# Patient Record
Sex: Female | Born: 1960 | Hispanic: Yes | Marital: Married | Smoking: Current every day smoker
Health system: Southern US, Community
[De-identification: ages and names within clinical notes are randomized; demographics above are authoritative.]

## PROBLEM LIST (undated history)

## (undated) DIAGNOSIS — E785 Hyperlipidemia, unspecified: Secondary | ICD-10-CM

## (undated) HISTORY — DX: Hyperlipidemia, unspecified: E78.5

## (undated) HISTORY — PX: BREAST CYST ASPIRATION: SHX578

---

## 2010-08-07 ENCOUNTER — Encounter: Admission: RE | Admit: 2010-08-07 | Discharge: 2010-08-07 | Payer: Self-pay | Admitting: Obstetrics and Gynecology

## 2010-10-30 ENCOUNTER — Ambulatory Visit: Payer: Self-pay | Admitting: Family Medicine

## 2010-10-30 DIAGNOSIS — R079 Chest pain, unspecified: Secondary | ICD-10-CM | POA: Insufficient documentation

## 2010-11-03 ENCOUNTER — Telehealth (INDEPENDENT_AMBULATORY_CARE_PROVIDER_SITE_OTHER): Payer: Self-pay

## 2010-12-26 NOTE — Progress Notes (Signed)
Summary: Courtesy call  Phone Note Outgoing Call   Call placed by: Areta Haber CMA,  November 03, 2010 1:58 PM Summary of Call: Courtesy call to pt - Courtesy call mess [facility] of hm number. Initial call taken by: Areta Haber CMA,  November 03, 2010 1:59 PM

## 2010-12-26 NOTE — Assessment & Plan Note (Signed)
Summary: FELL AND RIBS HURT (rm 3)   Vital Signs:  Patient Profile:   50 Years Old Female CC:      fell x 7days ago on steps and landed on Rt rib area Height:     63 inches Weight:      141.50 pounds O2 Sat:      97 % O2 treatment:    Room Air Temp:     98.1 degrees F oral Pulse rate:   66 / minute Resp:     14 per minute BP sitting:   106 / 65  (left arm)  Pt. in pain?   yes    Location:   RT rib area    Intensity:   5                   Updated Prior Medication List: ASPIRIN 325 MG TABS (ASPIRIN)   Current Allergies: No known allergies History of Present Illness Chief Complaint: fell x 7days ago on steps and landed on Rt rib area History of Present Illness:  Subjective:  Patient fell one week ago and hit her left chest.  She complains of persistent pain in the area, but no shortness of breath.  No fevers, chills, and sweats   REVIEW OF SYSTEMS Constitutional Symptoms      Denies fever, chills, night sweats, weight loss, weight gain, and fatigue.  Eyes       Denies change in vision, eye pain, eye discharge, glasses, contact lenses, and eye surgery. Ear/Nose/Throat/Mouth       Denies hearing loss/aids, change in hearing, ear pain, ear discharge, dizziness, frequent runny nose, frequent nose bleeds, sinus problems, sore throat, hoarseness, and tooth pain or bleeding.  Respiratory       Denies dry cough, productive cough, wheezing, shortness of breath, asthma, bronchitis, and emphysema/COPD.  Cardiovascular       Denies murmurs, chest pain, and tires easily with exhertion.    Gastrointestinal       Denies stomach pain, nausea/vomiting, diarrhea, constipation, blood in bowel movements, and indigestion. Genitourniary       Denies painful urination, kidney stones, and loss of urinary control. Neurological       Denies paralysis, seizures, and fainting/blackouts. Musculoskeletal       Denies muscle pain, joint pain, joint stiffness, decreased range of motion, redness,  swelling, muscle weakness, and gout.      Comments: RT rib pain Skin       Denies bruising, unusual mles/lumps or sores, and hair/skin or nail changes.  Psych       Denies mood changes, temper/anger issues, anxiety/stress, speech problems, depression, and sleep problems. Other Comments: pt c/o fall x 7days ago and landed on her RT rib area. still having pain with coughing or deep breathing. no SOB or fever.   Past History:  Past Medical History: Unremarkable  Past Surgical History: Denies surgical history  Family History: father- Cancer-deceased (unsure of type of CA) Family History Hypertension  Social History: Married Current Smoker- 1 pack per week Alcohol use-yes- 2 drinks per wk Smoking Status:  current   Objective:  No acute distress, alert and oriented  Eyes:  Pupils are equal, round, and reactive to light and accomdation.  Extraocular movement is intact.  Conjunctivae are not inflamed.  Pharynx:  Normal  Neck:  Supple.  No adenopathy is present.  Lungs:  Clear to auscultation.  Breath sounds are equal.  Chest:  Tenderness right posterior/lateral ribs.  No crepitance Heart:  Regular rate and rhythm without murmurs, rubs, or gallops.  Abdomen:  Nontender without masses or hepatosplenomegaly.  Bowel sounds are present.  No CVA or flank tenderness.  Skin:  No rash Chest X-ray with right rib detail:  negative Assessment New Problems: RIB PAIN, RIGHT SIDED (ICD-786.50) CONTUSION, RIGHT CHEST WALL (ICD-922.1)   Plan New Medications/Changes: NAPROXEN 500 MG TABS (NAPROXEN) One by mouth two times a day pc  #20 x 1, 10/30/2010, Donna Christen MD  New Orders: T-DG Ribs Unilateral w/Chest*R* [71101] Rib Belt [L0210] New Patient Level III [16109] Planning Comments:   Dispensed rib belt.  Begin Naproxen. ([facility] information and instruction patient handout given)  Return for worsening symptoms   The patient and/or caregiver has been counseled thoroughly with  regard to medications prescribed including dosage, schedule, interactions, rationale for use, and possible side effects and they verbalize understanding.  Diagnoses and expected course of recovery discussed and will return if not improved as expected or if the condition worsens. Patient and/or caregiver verbalized understanding.  Prescriptions: NAPROXEN 500 MG TABS (NAPROXEN) One by mouth two times a day pc  #20 x 1   Entered and Authorized by:   Donna Christen MD   Signed by:   Donna Christen MD on 10/30/2010   Method used:   Print then Give to Patient   RxID:   [id number]   Orders Added: 1)  T-DG Ribs Unilateral w/Chest*R* [71101] 2)  Rib Belt [L0210] 3)  New Patient Level III [99203]   Immunizations Administered:  Influenza Vaccine:    Vaccine Type: fluzone    Site: right deltoid    Mfr: Sanofi Pasteur    Dose: 0.5 ml    Route: IM    Given by: Lajean Saver RN    Exp. Date: 05/26/2011    Lot #: [id number]    VIS given: 06/20/10 version given October 30, 2010.   Immunizations Administered:  Influenza Vaccine:    Vaccine Type: fluzone    Site: right deltoid    Mfr: Sanofi Pasteur    Dose: 0.5 ml    Route: IM    Given by: Lajean Saver RN    Exp. Date: 05/26/2011    Lot #: [id number]    VIS given: 06/20/10 version given October 30, 2010.  Flu Vaccine Consent Questions:    Do you have a history of severe allergic reactions to this vaccine? no    Any prior history of allergic reactions to egg and/or gelatin? no    Do you have a sensitivity to the preservative Thimersol? no    Do you have a past history of Guillan-Barre Syndrome? no    Do you currently have an acute febrile illness? no    Have you ever had a severe reaction to latex? no    Vaccine information given and explained to patient? yes    Are you currently pregnant? no

## 2010-12-26 NOTE — Miscellaneous (Signed)
Summary: FLU VACCINE FORM  FLU VACCINE FORM   Imported By: Dannette Barbara 10/30/2010 12:01:01  _____________________________________________________________________  External Attachment:    Type:   Image     Comment:   External Document

## 2013-06-13 ENCOUNTER — Encounter: Payer: Self-pay | Admitting: Emergency Medicine

## 2013-06-13 ENCOUNTER — Emergency Department
Admission: EM | Admit: 2013-06-13 | Discharge: 2013-06-13 | Disposition: A | Discharge status: Home / Self Care | Payer: Managed Care, Other (non HMO) | Source: Home / Self Care | Attending: Emergency Medicine | Admitting: Emergency Medicine

## 2013-06-13 DIAGNOSIS — L0231 Cutaneous abscess of buttock: Secondary | ICD-10-CM

## 2013-06-13 DIAGNOSIS — L03317 Cellulitis of buttock: Secondary | ICD-10-CM

## 2013-06-13 MED ORDER — SULFAMETHOXAZOLE-TRIMETHOPRIM 800-160 MG PO TABS: 1.0000 | ORAL_TABLET | Freq: Two times a day (BID) | ORAL | Status: DC

## 2013-06-13 MED ORDER — HYDROCODONE-ACETAMINOPHEN 5-325 MG PO TABS: 1.0000 | ORAL_TABLET | Freq: Four times a day (QID) | ORAL | Status: DC | PRN

## 2013-06-13 NOTE — ED Notes (Signed)
Reports onset of pustule on buttocks x 3 days ago; now reddened and swollen with some pain. States husband has similar spot on his arm.

## 2013-06-14 ENCOUNTER — Emergency Department (INDEPENDENT_AMBULATORY_CARE_PROVIDER_SITE_OTHER)
Admission: EM | Admit: 2013-06-14 | Discharge: 2013-06-14 | Disposition: A | Discharge status: Home / Self Care | Payer: Managed Care, Other (non HMO) | Source: Home / Self Care | Attending: Family Medicine | Admitting: Family Medicine

## 2013-06-14 ENCOUNTER — Encounter: Payer: Self-pay | Admitting: Emergency Medicine

## 2013-06-14 DIAGNOSIS — L0231 Cutaneous abscess of buttock: Secondary | ICD-10-CM

## 2013-06-14 LAB — POCT CBC W AUTO DIFF (K'VILLE URGENT CARE)

## 2013-06-14 MED ORDER — CEFTRIAXONE SODIUM 1 G IJ SOLR
1.0000 g | Freq: Once | INTRAMUSCULAR | Status: AC
  Administered 2013-06-14: 1 g via INTRAMUSCULAR

## 2013-06-14 MED ORDER — DOXYCYCLINE HYCLATE 100 MG PO CAPS: 100.0000 mg | ORAL_CAPSULE | Freq: Two times a day (BID) | ORAL | Status: AC

## 2013-06-14 NOTE — ED Notes (Signed)
Wound recheck; seen yesterday for I&D of pustule on right buttocks.

## 2013-06-14 NOTE — ED Provider Notes (Addendum)
History    CSN: [id number] Arrival date & time 06/13/13  0948  First MD Initiated Contact with Patient 06/13/13 [phone]     CC: Abcess Right Buttock   HPI: Pt presents for evaluation of a cutaneous abscess. The lesion is located in the right buttock. Onset was 3 days ago. Symptoms have gradually worsened. Abscess has associated symptoms of pain. She  does have previous history of cutaneous abscesses. There is not a previous history of MRSA.  She does not have a history of diabetes.  ROS:  Systemic:  No fevers, chills or sweats. Skin:  No rash or other skin lesions.  PE:  General:  Alert, active, in no distress. Skin:  There was a 4 cm, red, hot, tender, fluctulent mass on the right buttock.    IMP:  Abscess of the right buttock.   Procedure: Incision and Drainage The procedure, risks and complications have been discussed in detail (including, but not limited to airway compromise, infection, bleeding) with the patient, and the patient has given verbal consent to the procedure.  The skin was prepped with Betadine and alcohol. After adequate local anesthesia with 4 cc of 1% lidocaine with epinephrine), I&D with a #11 blade was performed on the abscess as described in physical exam section. Purulent drainage: present.  Blunt dissection was used to break up loculations.  The wound was then packed with 1/4 inch iodoform gauze.   Wound culture was sent. A sterile pressure dressing was applied. The patient tolerated the procedure well. She was given aftercare instructions and started on antibiotics as listed below.   HPI History reviewed. No pertinent past medical history. History reviewed. No pertinent past surgical history. History reviewed. No pertinent family history. History  Substance Use Topics  . Smoking status: Never Smoker   . Smokeless tobacco: Not on file  . Alcohol Use: No   OB History   Grav Para Term Preterm Abortions TAB SAB Ect Mult Living                 Review of  Systems  Allergies  Review of patient's allergies indicates no known allergies.  Home Medications   Current Outpatient Rx  Name  Route  Sig  Dispense  Refill  . HYDROcodone-acetaminophen (NORCO/VICODIN) 5-325 MG per tablet   Oral   Take 1-2 tablets by mouth every 6 (six) hours as needed for pain. Take with food.   10 tablet   0   . sulfamethoxazole-trimethoprim (SEPTRA DS) 800-160 MG per tablet   Oral   Take 1 tablet by mouth 2 (two) times daily. (This is an antibiotic to help for infection)   20 tablet   0    BP 122/71  Pulse 70  Temp(Src) 98.4 F (36.9 C) (Oral)  Ht 5' 2.5" (1.588 m)  Wt 145 lb (65.772 kg)  BMI 26.08 kg/m2  SpO2 98% Physical Exam  Nursing note and vitals reviewed. Constitutional: She is oriented to person, place, and time. She appears well-developed and well-nourished. No distress.  HENT:  Head: Normocephalic and atraumatic.  Eyes: Conjunctivae and EOM are normal. Pupils are equal, round, and reactive to light. No scleral icterus.  Neck: Normal range of motion.  Cardiovascular: Normal rate.   Pulmonary/Chest: Effort normal.  Abdominal: She exhibits no distension.  Musculoskeletal: Normal range of motion.  Neurological: She is alert and oriented to person, place, and time.  Skin: Skin is warm.  Psychiatric: She has a normal mood and affect.    ED  Course  Procedures (including critical care time) Labs Reviewed  WOUND CULTURE   No results found. 1. Abscess of buttock, right     MDM  See above for details of incision and drainage. Wound culture sent. Septra DS BID.-This antibiotic was chosen for potential MRSA coverage as well as gram-negative coverage. Small prescription for Vicodin if needed for acute postprocedure pain. Questions invited and answered. Written instructions given. Red flags discussed. Keep iodoform packing in place, then return here tomorrow to recheck and redress wound. She voiced understanding and agreement.  Lajean Manes, MD 06/14/13 1308  Lajean Manes, MD 06/14/13 1000

## 2013-06-14 NOTE — ED Provider Notes (Signed)
History    CSN: [id number] Arrival date & time 06/14/13  1102  First MD Initiated Contact with Patient 06/14/13 1102     Chief Complaint  Patient presents with  . Wound Check    HPI Patient presents today for right buttock abscess check. Patient seen yesterday for right buttock abscess. Incision and drainage was done at the bedside. Patient was placed on Bactrim as well as Vicodin. Patient states that pain has persisted. Does report some subjective chills overnight. Was not able to sleep secondary to pain. No nausea vomiting. Patient has taken antibiotics as prescribed per patient.  History reviewed. No pertinent past medical history. History reviewed. No pertinent past surgical history. History reviewed. No pertinent family history. History  Substance Use Topics  . Smoking status: Never Smoker   . Smokeless tobacco: Not on file  . Alcohol Use: No   OB History   Grav Para Term Preterm Abortions TAB SAB Ect Mult Living                 Review of Systems  All other systems reviewed and are negative.    Allergies  Review of patient's allergies indicates no known allergies.  Home Medications   Current Outpatient Rx  Name  Route  Sig  Dispense  Refill  . doxycycline (VIBRAMYCIN) 100 MG capsule   Oral   Take 1 capsule (100 mg total) by mouth 2 (two) times daily.   14 capsule   0   . HYDROcodone-acetaminophen (NORCO/VICODIN) 5-325 MG per tablet   Oral   Take 1-2 tablets by mouth every 6 (six) hours as needed for pain. Take with food.   10 tablet   0   . sulfamethoxazole-trimethoprim (SEPTRA DS) 800-160 MG per tablet   Oral   Take 1 tablet by mouth 2 (two) times daily. (This is an antibiotic to help for infection)   20 tablet   0    BP 107/67  Pulse 79  Temp(Src) 99.6 F (37.6 C) (Oral)  Resp 16  Ht 5' 2.5" (1.588 m)  Wt 145 lb (65.772 kg)  BMI 26.08 kg/m2  SpO2 97% Physical Exam  Constitutional: She appears well-developed and well-nourished.    HENT:  Head: Normocephalic and atraumatic.  Eyes: Conjunctivae are normal. Pupils are equal, round, and reactive to light.  Neck: Normal range of motion.  Cardiovascular: Normal rate and regular rhythm.   Pulmonary/Chest: Effort normal.  Abdominal: Soft.  Genitourinary:     Right buttock abscess with incision present. 3-4 cm area peripheral erythema with mild induration.   Skin: Rash noted.    ED Course  INCISION AND DRAINAGE Date/Time: 06/14/2013 11:43 AM Performed by: Doree Albee Authorized by: Doree Albee Consent: Verbal consent obtained. Risks and benefits: risks, benefits and alternatives were discussed Type: abscess Body area: anogenital Location details: gluteal cleft Anesthesia: local infiltration Local anesthetic: lidocaine 2% with epinephrine Patient sedated: no Scalpel size: 11 Drainage: purulent Drainage amount: moderate Wound treatment: wound left open Packing material: 1/4 in iodoform gauze Patient tolerance: Patient tolerated the procedure well with no immediate complications. Comments: Peripheral loculations were manually debrided at the bedside.   (including critical care time) Labs Reviewed  POCT CBC W AUTO DIFF ([facility])   No results found. 1. Abscess of buttock, right     MDM  Abscess manually derided the bedside. We'll check a baseline CBC as to follow clinical course. Will transition to doxycycline pending wound culture. Stop bactrim.  1 g IM Rocephin  given. Plan for patient to followup within 24 hours for reevaluation. Patient is currently uninsured. However, if clinical course remains unchanged, would  consider a surgical referral to better manage this. Discussed infectious red flags with patient at length. Follow up in am.     Doree Albee, MD 06/24/13 [phone]

## 2013-06-15 ENCOUNTER — Encounter: Payer: Self-pay | Admitting: *Deleted

## 2013-06-15 ENCOUNTER — Emergency Department
Admission: EM | Admit: 2013-06-15 | Discharge: 2013-06-15 | Disposition: A | Discharge status: Home / Self Care | Payer: Managed Care, Other (non HMO) | Source: Home / Self Care | Attending: Family Medicine | Admitting: Family Medicine

## 2013-06-15 DIAGNOSIS — Z4801 Encounter for change or removal of surgical wound dressing: Secondary | ICD-10-CM

## 2013-06-15 MED ORDER — HYDROCODONE-ACETAMINOPHEN 5-325 MG PO TABS: 1.0000 | ORAL_TABLET | Freq: Four times a day (QID) | ORAL | Status: DC | PRN

## 2013-06-15 NOTE — ED Provider Notes (Signed)
   History    CSN: [id number] Arrival date & time 06/15/13  1033  First MD Initiated Contact with Patient 06/15/13 1046     Chief Complaint  Patient presents with  . Wound Check      HPI Comments: Patient returns for dressing change and packing removal.  She states that she feels better with less buttock pain.  No fevers, chills, and sweats.  The history is provided by the patient.   History reviewed. No pertinent past medical history. History reviewed. No pertinent past surgical history. History reviewed. No pertinent family history. History  Substance Use Topics  . Smoking status: Never Smoker   . Smokeless tobacco: Not on file  . Alcohol Use: No   OB History   Grav Para Term Preterm Abortions TAB SAB Ect Mult Living                 Review of Systems  Constitutional: Negative for fever and chills.  Gastrointestinal: Negative for nausea and vomiting.    Allergies  Review of patient's allergies indicates no known allergies.  Home Medications   Current Outpatient Rx  Name  Route  Sig  Dispense  Refill  . doxycycline (VIBRAMYCIN) 100 MG capsule   Oral   Take 1 capsule (100 mg total) by mouth 2 (two) times daily.   14 capsule   0   . HYDROcodone-acetaminophen (NORCO/VICODIN) 5-325 MG per tablet   Oral   Take 1 tablet by mouth every 6 (six) hours as needed for pain. Take with food.   10 tablet   0   . sulfamethoxazole-trimethoprim (SEPTRA DS) 800-160 MG per tablet   Oral   Take 1 tablet by mouth 2 (two) times daily. (This is an antibiotic to help for infection)   20 tablet   0    Temp(Src) 98.6 F (37 C) (Oral) Physical Exam Nursing notes and Vital Signs reviewed. Appearance:  Patient appears healthy, stated age, and in no acute distress Eyes:  Pupils are equal, round, and reactive to light and accomodation.  Extraocular movement is intact.  Conjunctivae are not inflamed Skin:  Right buttock.  Wound dressing and packing removed.  No purulent drainage in  wound.  Good granulation tissue present.  Area around wound remains tender but not swollen.  Minimal surrounding erythema.  Small amount of 1/4 inch Iodoform gauze inserted, and bandage applied.   ED Course  Procedures  none   1. Dressing change or removal, surgical wound     MDM  Patient clinically improved.  Wound culture showed staph aureus sensitive to tetracycline. White blood count yesterday normal. Continue doxycycline.  Return tomorrow for followup Leave bandage in place until followup visit tomorrow.   Lattie Haw, MD 06/15/13 1118

## 2013-06-15 NOTE — ED Notes (Signed)
The pt is here today for a recheck of her RT buttock wound.

## 2013-06-16 ENCOUNTER — Emergency Department
Admission: EM | Admit: 2013-06-16 | Discharge: 2013-06-16 | Disposition: A | Discharge status: Home / Self Care | Payer: Managed Care, Other (non HMO) | Source: Home / Self Care | Attending: Emergency Medicine | Admitting: Emergency Medicine

## 2013-06-16 ENCOUNTER — Encounter: Payer: Self-pay | Admitting: *Deleted

## 2013-06-16 DIAGNOSIS — L0231 Cutaneous abscess of buttock: Secondary | ICD-10-CM

## 2013-06-16 LAB — WOUND CULTURE
Gram Stain: NONE SEEN
Gram Stain: NONE SEEN

## 2013-06-16 NOTE — ED Notes (Signed)
Roberta Livingston is here for wound check on buttocks. Dressing removed, packing in place. She denies pain/fever/chills.

## 2013-06-17 NOTE — ED Provider Notes (Signed)
   History    CSN: [id number] Arrival date & time 06/16/13  1012  First MD Initiated Contact with Patient 06/16/13 1031     Chief Complaint  Patient presents with  . Wound Check  Zurri is here for wound recheck on R buttocks. Dressing removed, packing in place. She denies fever/chills.--she reports continued significant improvement daily with less pain since switching from Septra to doxycycline.  She is taking doxycycline as prescribed. (note: the wound culture throughout MRSA, sensitive to tetracycline and Septra)   HPI History reviewed. No pertinent past medical history. History reviewed. No pertinent past surgical history. History reviewed. No pertinent family history. History  Substance Use Topics  . Smoking status: Never Smoker   . Smokeless tobacco: Not on file  . Alcohol Use: No   OB History   Grav Para Term Preterm Abortions TAB SAB Ect Mult Living                 Review of Systems  All other systems reviewed and are negative.    Allergies  Review of patient's allergies indicates no known allergies.  Home Medications   Current Outpatient Rx  Name  Route  Sig  Dispense  Refill  . doxycycline (VIBRAMYCIN) 100 MG capsule   Oral   Take 1 capsule (100 mg total) by mouth 2 (two) times daily.   14 capsule   0    BP 121/75  Pulse 81  Temp(Src) 98.4 F (36.9 C) (Oral)  Resp 14  SpO2 97% Physical Exam No distress.  Mild erythema and tenderness around wound cavity right buttock, scant yellow drainage. No bleeding.  ED Course  Procedures (including critical care time) Labs Reviewed - No data to display No results found. 1. Cellulitis and abscess of buttock     MDM  The right buttock abscess wound is much improving. Today, I removed the packing. Then, cleansed the abscess cavity with peroxide-soaked Q-tip. Repacked with iodoform guaze . In my opinion, this likely will not need to be repacked in the future as she's improving.  The following verbal and  written instructions given: The wound/infection is improving. Today, we cleaned the wound and repacked with iodoform gauze and redress the wound.  Keep dressing clean and dry today. Tomorrow morning: Take the dressing off. Pull out the gauze from inside the wound, then throat the gauze away. Clean inside the wound with peroxide-soaked Q-tip. Then, redress the wound.  Finish the prescription for doxycycline (antibiotic)  You do not need to followup here if you continue to improve, but if you're not better in one week, return here for recheck.  Otherwise, followup with your regular doctor.   Lajean Manes, MD 06/17/13 [phone]

## 2013-07-07 ENCOUNTER — Ambulatory Visit (INDEPENDENT_AMBULATORY_CARE_PROVIDER_SITE_OTHER): Payer: Managed Care, Other (non HMO) | Admitting: Family Medicine

## 2013-07-07 ENCOUNTER — Encounter: Payer: Self-pay | Admitting: Family Medicine

## 2013-07-07 VITALS — BP 85/54 | HR 65 | Ht 64.0 in | Wt 148.0 lb

## 2013-07-07 DIAGNOSIS — R300 Dysuria: Secondary | ICD-10-CM

## 2013-07-07 DIAGNOSIS — Z131 Encounter for screening for diabetes mellitus: Secondary | ICD-10-CM

## 2013-07-07 DIAGNOSIS — N926 Irregular menstruation, unspecified: Secondary | ICD-10-CM | POA: Insufficient documentation

## 2013-07-07 DIAGNOSIS — Z130 Encounter for screening for diseases of the blood and blood-forming organs and certain disorders involving the immune mechanism: Secondary | ICD-10-CM

## 2013-07-07 DIAGNOSIS — Z1239 Encounter for other screening for malignant neoplasm of breast: Secondary | ICD-10-CM

## 2013-07-07 DIAGNOSIS — Z13220 Encounter for screening for lipoid disorders: Secondary | ICD-10-CM

## 2013-07-07 LAB — COMPLETE METABOLIC PANEL WITH GFR
Alkaline Phosphatase: 91 U/L (ref 39–117)
BUN: 10 mg/dL (ref 6–23)
GFR, Est Non African American: >89 mL/min
Glucose, Bld: 88 mg/dL (ref 70–99)
Sodium: 138 mEq/L (ref 135–145)
Total Bilirubin: 0.4 mg/dL (ref 0.3–1.2)
Total Protein: 6.8 g/dL (ref 6.0–8.3)

## 2013-07-07 LAB — TSH: TSH: 1.368 u[IU]/mL (ref 0.350–4.500)

## 2013-07-07 LAB — CBC
HCT: 42.1 % (ref 36.0–46.0)
Hemoglobin: 14 g/dL (ref 12.0–15.0)
RBC: 5.07 MIL/uL (ref 3.87–5.11)
WBC: 5.8 10*3/uL (ref 4.0–10.5)

## 2013-07-07 LAB — LIPID PANEL
HDL: 64 mg/dL (ref >39)
Total CHOL/HDL Ratio: 3.5 Ratio

## 2013-07-07 LAB — POCT URINALYSIS DIPSTICK
Glucose, UA: NEGATIVE
Nitrite, UA: NEGATIVE
Urobilinogen, UA: 0.2

## 2013-07-07 LAB — POCT URINE PREGNANCY: Preg Test, Ur: NEGATIVE

## 2013-07-07 NOTE — Progress Notes (Signed)
CC: Roberta Livingston is a 52 y.o. female is here for Establish Care   Subjective: HPI:  Last Pap smear and mammogram 2011 normal  Very pleasant 52 year old Spanish-speaking female here to establish care  Patient complains of missed period in July her last menstrual period was in June. She has trouble describing whether or not. Have been irregular or predictable over the past year. Volume and character. In June was normal for her. Since then she denies abdominal pain or pelvic pain nor vaginal discharge she does have unprotected sex with a single partner.  She's unsure when her mother went through menopause grandmother went through menopause around age 105  Patient complains of mild dysuria that occurred on Saturday that has significantly improved since then it is barely present today is mild to trace in severity is only worse with urinating he described as a burning on the outside of her vagina. Denies urinary frequency urgency or hesitancy  Review of Systems - General ROS: negative for - chills, fever, night sweats, weight gain or weight loss Ophthalmic ROS: negative for - decreased vision Psychological ROS: negative for - anxiety or depression ENT ROS: negative for - hearing change, nasal congestion, tinnitus or allergies Hematological and Lymphatic ROS: negative for - bleeding problems, bruising or swollen lymph nodes Breast ROS: negative Respiratory ROS: no cough, shortness of breath, or wheezing Cardiovascular ROS: no chest pain or dyspnea on exertion Gastrointestinal ROS: no abdominal pain, change in bowel habits, or black or bloody stools Genito-Urinary ROS: negative for - genital discharge, genital ulcers, incontinence or abnormal bleeding from genitals other than that described above Musculoskeletal ROS: negative for - joint pain or muscle pain Neurological ROS: negative for - headaches or memory loss Dermatological ROS: negative for lumps, mole changes, rash and skin lesion  changes  History reviewed. No pertinent past medical history.   Family History  Problem Relation Age of Onset  . Hypertension Mother      History  Substance Use Topics  . Smoking status: Current Every Day Smoker -- 5 years  . Smokeless tobacco: Not on file  . Alcohol Use: No     Objective: Filed Vitals:   07/07/13 1002  BP: 85/54  Pulse: 65    General: Alert and Oriented, No Acute Distress HEENT: Pupils equal, round, reactive to light. Conjunctivae clear.  Moist mucous membranes pharynx unremarkable neck is supple with no palpable thyromegaly Lungs: Clear to auscultation bilaterally, no wheezing/ronchi/rales.  Comfortable work of breathing. Good air movement. Cardiac: Regular rate and rhythm. Normal S1/S2.  No murmurs, rubs, nor gallops.   Extremities: No peripheral edema.  Strong peripheral pulses.  Mental Status: No depression, anxiety, nor agitation. Skin: Warm and dry.  Assessment & Plan: Roberta Livingston was seen today for establish care.  Diagnoses and associated orders for this visit:  Irregular periods - TSH - POCT urine pregnancy  Lipid screening - Lipid panel  Screening, anemia, deficiency, iron - CBC  Diabetes mellitus screening - COMPLETE METABOLIC PANEL WITH GFR  Dysuria - Urine culture - Urinalysis Dipstick  Breast cancer screening - MM Digital Screening; Future    Irregular menstrual periods: Urine pregnancy test negative Will obtain TSH if normal he'll confident this is likely due to perimenopause patient has been counseled, will check for platelet abnormality and anemia She is lipid check in over a year lipids today She has not had blood sugar or renal function checked in over a year we'll check a fasting Dysuria: Urinalysis unremarkable we'll follow culture She's due  for breast cancer screening mammogram placed She'll return in approximately one month for CPE   Return in about 4 weeks (around 08/04/2013) for CPE.

## 2013-07-08 ENCOUNTER — Encounter: Payer: Self-pay | Admitting: Family Medicine

## 2013-07-08 DIAGNOSIS — E785 Hyperlipidemia, unspecified: Secondary | ICD-10-CM | POA: Insufficient documentation

## 2013-07-08 HISTORY — DX: Hyperlipidemia, unspecified: E78.5

## 2013-07-09 LAB — URINE CULTURE: Organism ID, Bacteria: NO GROWTH

## 2013-08-04 ENCOUNTER — Ambulatory Visit (INDEPENDENT_AMBULATORY_CARE_PROVIDER_SITE_OTHER): Payer: Managed Care, Other (non HMO)

## 2013-08-04 DIAGNOSIS — Z1239 Encounter for other screening for malignant neoplasm of breast: Secondary | ICD-10-CM

## 2013-08-04 DIAGNOSIS — Z1231 Encounter for screening mammogram for malignant neoplasm of breast: Secondary | ICD-10-CM

## 2013-08-10 ENCOUNTER — Telehealth: Payer: Self-pay | Admitting: *Deleted

## 2013-08-10 NOTE — Telephone Encounter (Signed)
[name], Will you please let Roberta Livingston know that there were no suspicious findings on her mammogram.  I would recommend routine annual mammograms. The radiologist told me they were going to mail her her results, just to be sure can you confirm her address we have on file.

## 2013-08-10 NOTE — Telephone Encounter (Signed)
Pt is calling about her mammogram results

## 2013-08-10 NOTE — Telephone Encounter (Signed)
Pt's spouse notified.

## 2013-09-29 ENCOUNTER — Other Ambulatory Visit (HOSPITAL_COMMUNITY)
Admission: RE | Admit: 2013-09-29 | Discharge: 2013-09-29 | Disposition: A | Discharge status: Home / Self Care | Payer: Managed Care, Other (non HMO) | Source: Ambulatory Visit | Attending: Family Medicine | Admitting: Family Medicine

## 2013-09-29 ENCOUNTER — Ambulatory Visit (INDEPENDENT_AMBULATORY_CARE_PROVIDER_SITE_OTHER): Payer: Managed Care, Other (non HMO) | Admitting: Family Medicine

## 2013-09-29 ENCOUNTER — Encounter: Payer: Self-pay | Admitting: Family Medicine

## 2013-09-29 VITALS — BP 111/66 | HR 62 | Ht 64.0 in | Wt 150.0 lb

## 2013-09-29 DIAGNOSIS — F17200 Nicotine dependence, unspecified, uncomplicated: Secondary | ICD-10-CM | POA: Insufficient documentation

## 2013-09-29 DIAGNOSIS — Z0000 Encounter for general adult medical examination without abnormal findings: Secondary | ICD-10-CM

## 2013-09-29 DIAGNOSIS — Z1211 Encounter for screening for malignant neoplasm of colon: Secondary | ICD-10-CM

## 2013-09-29 DIAGNOSIS — Z01419 Encounter for gynecological examination (general) (routine) without abnormal findings: Secondary | ICD-10-CM | POA: Insufficient documentation

## 2013-09-29 DIAGNOSIS — Z23 Encounter for immunization: Secondary | ICD-10-CM

## 2013-09-29 DIAGNOSIS — Z1151 Encounter for screening for human papillomavirus (HPV): Secondary | ICD-10-CM | POA: Insufficient documentation

## 2013-09-29 DIAGNOSIS — N9089 Other specified noninflammatory disorders of vulva and perineum: Secondary | ICD-10-CM | POA: Insufficient documentation

## 2013-09-29 MED ORDER — BUPROPION HCL ER (SR) 150 MG PO TB12: ORAL_TABLET | ORAL | Status: DC

## 2013-09-29 NOTE — Patient Instructions (Signed)
La dieta DASH  (DASH Diet) La dieta DASH (por su nombre en ingls) significa "Abordaje diettico para detener la hipertensin". Es un plan de alimentacin saludable que ha demostrado reducir la presin arterial elevada (hipertensin) en tan solo 14 das, mientras probablemente tambin ofrezca otros beneficios significativos para la salud. Estos otros beneficios incluyen la reduccin del riesgo de sufrir cncer de mama despus de la menopausia y de sufrir diabetes tipo 2, enfermedades cardacas, cncer de colon e ictus. Los beneficios para la salud tambin incluyen la prdida de peso y la disminucin del riesgo de insuficiencia renal en pacientes con enfermedad renal crnica.  GUAS PARA LA DIETA   Limite la cantidad de sal (sodio). Su dieta debe contener menos de 1500 mg de sodio por da.  Limite el consumo de carbohidratos refinados o procesados. Su dieta debe incluir principalmente granos enteros. Consuma postres con moderacin y limite el agregado de azcar.  Incluya pequeas cantidades de grasas saludables para el corazn. Estos tipos de grasas estn contenidas en las nueces, aceites y margarina. Limite las grasas saturadas y trans. Estas grasas han demostrado ser perjudiciales para el organismo. ELECCIN DE LOS ALIMENTOS  Los siguientes grupos de alimentos se basan en una dieta de 2000 caloras. Consulte a su nutricionista matriculado cules son sus necesidades calricas individuales.  Granos y productos elaborados con granos (6 a 8 porciones por da).   Consuma ms a menudo: Pan integral, arroz integral, pasta de trigo o de harina integral, quinoa, palomitas de maz sin grasa o sal aadida (infladas).  Consuma con menos frecuencia:  Pan blanco, pastas blancas, arroz blanco, pan de maz. Verduras (4 a 5 porciones por da).   Consuma ms a menudo: Hortalizas frescas, congeladas y enlatadas. Puede consumir las hortalizas crudas, al vapor, asadas o grilladas con una mnima cantidad de  grasas.  Consuma con menos frecuencia o evite: Vegetales con crema o fritos. Verduras en salsa de queso. Frutas (4 a 5 porciones por da).  Consuma ms a menudo: Frutas frescas, en conserva (en su jugo natural) o frutas congeladas. Frutas secas sin el agregado de azcar. Cien por ciento jugo de fruta ( taza [237 ml] por da).  Consuma con menos frecuencia: Frutas secas con azcar agregado. Fruta enlatada en almbar light o espeso. Carnes magras, pescado y aves de corral (2 porciones o menos por da. Una porcin es de 3 a 4 oz. [85-114 g]).  Consuma ms a menudo: Carne molida 90% magra o ms de lomo o solomillo . Cortes redondos de carne, pechuga de pollo y de pavo. Todos los pescados. Prepare la carne al horno o asada a la parrilla o al grill. Nada debe ser frito.  Consuma con menos frecuencia o evite: Cortes grasos de carne, pavo o pata, muslo o ala de pollo. Cortes de carne o pescado fritos. Lcteos (2 a 3 porciones)  Consuma ms a menudo: La leche descremada o con bajo contenido de grasa, yogur descremado o semidescremado, queso con bajo contenido en grasa o parcialmente descremado.  Consuma con menos frecuencia o evite: Leche (entera, al 2%). Yogur de leche entera. Quesos con toda su grasa. Nueces, semillas y legumbres (4 a 5 porciones por semana).  Consuma ms a menudo: Todos los alimentos sin sal agregada.  Consuma con menos frecuencia o evite: Nueces y semillas con sal, frijoles enlatados con sal agregada. Grasas y dulces (limitados)  Consuma ms a menudo: Aceites vegetales, margarinas en pote sin grasas trans, gelatina sin azcar. Mayonesa y aderezos para ensaladas.  Consuma   con menos frecuencia o evite: Aceites de coco, aceites de palma, mantequilla, margarina en barra, crema, mitad leche y mitad crema, galletas, dulces, pastel. PARA OBTENER MS INFORMACIN  El Dash Diet Eating Plan (Plan Nutricional Dieta Dash): www.dashdiet.org  Document Released: 11/01/2011 Document  Revised: 02/04/2012 [employer] Patient Information 2014 [employer].  

## 2013-09-29 NOTE — Addendum Note (Signed)
Addended by: Avon Gully C on: 09/29/2013 11:24 AM   Modules accepted: Orders

## 2013-09-29 NOTE — Progress Notes (Signed)
CC: Roberta Livingston is a 52 y.o. female is here for Annual Exam   Subjective: HPI:  Colonoscopy: She's never had this she refuses colonoscopy screening for colon cancer, agreeable to stool cards Papsmear: Normal 2011, she would like to repeat today Mammogram: BIRADS 1, 06/2013  Influenza Vaccine: will receive today Pneumovax: Declined Td/Tdap: due for Tdap today, overdue Zoster: (Start 50 yo)  Over the past 2 weeks have you been bothered by: - Little interest or pleasure in doing things: No - Feeling down depressed or hopeless: No  Her only complaint today is a skin mass on the right labia she's unsure how long it's been there it is nonpainful.    She expresses interest in help stopping smoking she has not had any benefit from nicotine gum or nicotine patches in the past  Review of Systems - General ROS: negative for - chills, fever, night sweats, weight gain or weight loss Ophthalmic ROS: negative for - decreased vision Psychological ROS: negative for - anxiety or depression ENT ROS: negative for - hearing change, nasal congestion, tinnitus or allergies Hematological and Lymphatic ROS: negative for - bleeding problems, bruising or swollen lymph nodes Breast ROS: negative Respiratory ROS: no cough, shortness of breath, or wheezing Cardiovascular ROS: no chest pain or dyspnea on exertion Gastrointestinal ROS: no abdominal pain, change in bowel habits, or black or bloody stools Genito-Urinary ROS: negative for - genital discharge, genital ulcers, incontinence or abnormal bleeding from genitals Musculoskeletal ROS: negative for - joint pain or muscle pain Neurological ROS: negative for - headaches or memory loss Dermatological ROS: negative for lumps, mole changes, rash and skin lesion changes Past Medical History  Diagnosis Date  . Hyperlipidemia LDL goal < 160 07/08/2013     Family History  Problem Relation Age of Onset  . Hypertension Mother      History  Substance Use  Topics  . Smoking status: Current Every Day Smoker -- 5 years  . Smokeless tobacco: Not on file  . Alcohol Use: No     Objective: Filed Vitals:   09/29/13 0923  BP: 111/66  Pulse: 62    General: No Acute Distress HEENT: Atraumatic, normocephalic, conjunctivae normal without scleral icterus.  No nasal discharge, hearing grossly intact, TMs with good landmarks bilaterally with no middle ear abnormalities, posterior pharynx clear without oral lesions. Neck: Supple, trachea midline, no cervical nor supraclavicular adenopathy. Pulmonary: Clear to auscultation bilaterally without wheezing, rhonchi, nor rales. Cardiac: Regular rate and rhythm.  No murmurs, rubs, nor gallops. No peripheral edema.  2+ peripheral pulses bilaterally. Abdomen: Bowel sounds normal.  No masses.  Non-tender without rebound.  Negative Murphy's sign. GU: Labia majora without abnormalities, right labia minora has a small skin tag it does not have any cauliflower appearance for wart like appearance.  Distal urethra unremarkable.  Vaginal wall integrity preserved without mucosal lesions.  Cervix non-tender and without gross lesions nor discharge.  MSK: Grossly intact, no signs of weakness.  Full strength throughout upper and lower extremities.  Full ROM in upper and lower extremities.  No midline spinal tenderness. Neuro: Gait unremarkable, CN II-XII grossly intact.  C5-C6 Reflex 2/4 Bilaterally, L4 Reflex 2/4 Bilaterally.  Cerebellar function intact. Skin: No rashes. Psych: Alert and oriented to person/place/time.  Thought process normal. No anxiety/depression.   Assessment & Plan: Roberta Livingston was seen today for annual exam.  Diagnoses and associated orders for this visit:  Annual physical exam  Routine general medical examination at a health care facility - Cytology - PAP  Screening for colon cancer  Tobacco use disorder - buPROPion (WELLBUTRIN SR) 150 MG 12 hr tablet; Start seven days before quit date.  Start with  one tablet every morning then one tablet twice a day, last dose before 6pm  Skin tag of vulva  Other Orders - Cancel: Ambulatory referral to Gastroenterology    Healthy lifestyle interventions including but limited to regular exercise, a healthy low fat diet, moderation of salt intake, the dangers of tobacco/alcohol/recreational drug use, nutrition supplementation, and accident avoidance were discussed with the patient and a handout was provided for future reference. We'll start Wellbutrin to help with smoking cessation. Discussed return stool cards for screening for colon cancer Discussed low suspicion of oncologic or viral process causing skin tag on right labia minora, return if appearance is growing or painful  Return in about 3 months (around 12/30/2013).

## 2016-01-24 ENCOUNTER — Emergency Department (INDEPENDENT_AMBULATORY_CARE_PROVIDER_SITE_OTHER): Payer: BLUE CROSS/BLUE SHIELD

## 2016-01-24 ENCOUNTER — Emergency Department
Admission: EM | Admit: 2016-01-24 | Discharge: 2016-01-24 | Disposition: A | Discharge status: Home / Self Care | Payer: BLUE CROSS/BLUE SHIELD | Source: Home / Self Care | Attending: Family Medicine | Admitting: Family Medicine

## 2016-01-24 ENCOUNTER — Encounter: Payer: Self-pay | Admitting: *Deleted

## 2016-01-24 DIAGNOSIS — R05 Cough: Secondary | ICD-10-CM | POA: Diagnosis not present

## 2016-01-24 DIAGNOSIS — J209 Acute bronchitis, unspecified: Secondary | ICD-10-CM | POA: Diagnosis not present

## 2016-01-24 MED ORDER — BENZONATATE 200 MG PO CAPS
200.0000 mg | ORAL_CAPSULE | Freq: Every day | ORAL | Status: DC
Start: 2016-01-24 — End: 2016-07-24

## 2016-01-24 MED ORDER — AZITHROMYCIN 250 MG PO TABS: ORAL_TABLET | ORAL | Status: DC

## 2016-01-24 NOTE — ED Provider Notes (Signed)
CSN: [id number]     Arrival date & time 01/24/16  4098 History   First MD Initiated Contact with Patient 01/24/16 [phone]     Chief Complaint  Patient presents with  . Cough      HPI Comments: About 10 days ago patient developed typical cold-like symptoms developing over several days,  including mild sore throat, sinus congestion, headache, fatigue, and cough.  The cough has persisted and become non-productive, worse at night.  She has had persistent chills during the past several days, and complains of pleuritic pain in her left chest.  She wheezes occasionally.   She continues to smoke.  The history is provided by the patient and a friend.    Past Medical History  Diagnosis Date  . Hyperlipidemia LDL goal < 160 07/08/2013   History reviewed. No pertinent past surgical history. Family History  Problem Relation Age of Onset  . Hypertension Mother    Social History  Substance Use Topics  . Smoking status: Current Every Day Smoker -- 0.50 packs/day for 5 years    Types: Cigarettes  . Smokeless tobacco: None  . Alcohol Use: No   OB History    No data available     Review of Systems + sore throat + cough ? left pleuritic pain + wheezing + nasal congestion ? post-nasal drainage No sinus pain/pressure No itchy/red eyes No earache No hemoptysis No SOB + fever, + chills No nausea No vomiting No abdominal pain No diarrhea No urinary symptoms No skin rash + fatigue No myalgias + headache Used OTC meds without relief  Allergies  Review of patient's allergies indicates no known allergies.  Home Medications   Prior to Admission medications   Medication Sig Start Date End Date Taking? Authorizing Provider  azithromycin (ZITHROMAX Z-PAK) 250 MG tablet Take 2 tabs today; then begin one tab once daily for 4 more days. 01/24/16   Lattie Haw, MD  benzonatate (TESSALON) 200 MG capsule Take 1 capsule (200 mg total) by mouth at bedtime. Take as needed for cough 01/24/16    Lattie Haw, MD   Meds Ordered and Administered this Visit  Medications - No data to display  BP 112/69 mmHg  Pulse 56  Temp(Src) 97.9 F (36.6 C) (Oral)  Resp 16  Ht  (1.626 m)  Wt 146 lb (66.225 kg)  BMI 25.05 kg/m2  SpO2 96% No data found.   Physical Exam Nursing notes and Vital Signs reviewed. Appearance:  Patient appears stated age, and in no acute distress Eyes:  Pupils are equal, round, and reactive to light and accomodation.  Extraocular movement is intact.  Conjunctivae are not inflamed  Ears:  Canals normal.  Tympanic membranes normal.  Nose:  Mildly congested turbinates.  No sinus tenderness.    Pharynx:  Normal Neck:  Supple.  Tender enlarged posterior nodes are palpated bilaterally  Lungs:  Clear to auscultation except faint wheeze right posterior chest.  Breath sounds are equal.  Moving air well. Chest:  Distinct tenderness to palpation over the mid-sternum.  Heart:  Regular rate and rhythm without murmurs, rubs, or gallops.  Abdomen:  Nontender without masses or hepatosplenomegaly.  Bowel sounds are present.  No CVA or flank tenderness.  Extremities:  No edema.  Skin:  No rash present.   ED Course  Procedures none   Imaging Review Dg Chest 2 View  01/24/2016  CLINICAL DATA:  Persistent cough for 10 days. Left-sided pleuritic chest pain. Smoker. EXAM: CHEST  2  VIEW COMPARISON:  None. FINDINGS: The heart size and mediastinal contours are within normal limits. Both lungs are clear. The visualized skeletal structures are unremarkable. IMPRESSION: No active cardiopulmonary disease. Electronically Signed   By: Myles Rosenthal M.D.   On: 01/24/2016 09:21     MDM   1. Acute bronchitis, unspecified organism    Begin Z-pak for atypical coverage.  Prescription written for Benzonatate [facility]) to take at bedtime for night-time cough.  Take plain guaifenesin (  extended release tabs such as Mucinex) twice daily, with plenty of water, for cough and congestion.   May add Pseudoephedrine ( , one or two every 4 to 6 hours) for sinus congestion.  Get adequate rest.   Try warm salt water gargles for sore throat.  Stop all antihistamines for now, and other non-prescription cough/cold preparations. May take Ibuprofen , 4 tabs every 8 hours with food for chest/sternum discomfort.   Follow-up with family doctor if not improving about one week.     Lattie Haw, MD 01/24/16 [phone]

## 2016-01-24 NOTE — ED Notes (Signed)
Pt c/o nonproductive cough, wheezing at times and hot/cold flashes x 10 days.

## 2016-01-24 NOTE — Discharge Instructions (Signed)
Take plain guaifenesin (  extended release tabs such as Mucinex) twice daily, with plenty of water, for cough and congestion.  May add Pseudoephedrine ( , one or two every 4 to 6 hours) for sinus congestion.  Get adequate rest.   Try warm salt water gargles for sore throat.  Stop all antihistamines for now, and other non-prescription cough/cold preparations. May take Ibuprofen , 4 tabs every 8 hours with food for chest/sternum discomfort.   Follow-up with family doctor if not improving about one week.

## 2016-07-24 ENCOUNTER — Ambulatory Visit (INDEPENDENT_AMBULATORY_CARE_PROVIDER_SITE_OTHER): Payer: BLUE CROSS/BLUE SHIELD | Admitting: Osteopathic Medicine

## 2016-07-24 ENCOUNTER — Encounter: Payer: Self-pay | Admitting: Osteopathic Medicine

## 2016-07-24 VITALS — BP 109/63 | HR 61 | Ht 66.0 in | Wt 143.0 lb

## 2016-07-24 DIAGNOSIS — Z0000 Encounter for general adult medical examination without abnormal findings: Secondary | ICD-10-CM | POA: Diagnosis not present

## 2016-07-24 DIAGNOSIS — F17200 Nicotine dependence, unspecified, uncomplicated: Secondary | ICD-10-CM

## 2016-07-24 DIAGNOSIS — Z23 Encounter for immunization: Secondary | ICD-10-CM | POA: Insufficient documentation

## 2016-07-24 DIAGNOSIS — Z1283 Encounter for screening for malignant neoplasm of skin: Secondary | ICD-10-CM | POA: Insufficient documentation

## 2016-07-24 DIAGNOSIS — R233 Spontaneous ecchymoses: Secondary | ICD-10-CM | POA: Insufficient documentation

## 2016-07-24 DIAGNOSIS — N63 Unspecified lump in unspecified breast: Secondary | ICD-10-CM

## 2016-07-24 DIAGNOSIS — Z1231 Encounter for screening mammogram for malignant neoplasm of breast: Secondary | ICD-10-CM | POA: Insufficient documentation

## 2016-07-24 LAB — CBC WITH DIFFERENTIAL/PLATELET
BASOS PCT: 1 %
Basophils Absolute: 53 cells/uL (ref 0–200)
EOS ABS: 212 {cells}/uL (ref 15–500)
Eosinophils Relative: 4 %
HEMATOCRIT: 44.9 % (ref 35.0–45.0)
HEMOGLOBIN: 15 g/dL (ref 11.7–15.5)
LYMPHS ABS: 1643 {cells}/uL (ref 850–3900)
Lymphocytes Relative: 31 %
MCH: 28.2 pg (ref 27.0–33.0)
MCHC: 33.4 g/dL (ref 32.0–36.0)
MCV: 84.6 fL (ref 80.0–100.0)
MONO ABS: 318 {cells}/uL (ref 200–950)
MPV: 10.9 fL (ref 7.5–12.5)
Monocytes Relative: 6 %
Neutro Abs: 3074 cells/uL (ref 1500–7800)
Neutrophils Relative %: 58 %
Platelets: 322 10*3/uL (ref 140–400)
RBC: 5.31 MIL/uL — AB (ref 3.80–5.10)
RDW: 13.6 % (ref 11.0–15.0)
WBC: 5.3 10*3/uL (ref 3.8–10.8)

## 2016-07-24 LAB — LIPID PANEL
CHOL/HDL RATIO: 3.7 ratio (ref <=5.0)
Cholesterol: 267 mg/dL — ABNORMAL HIGH (ref 125–200)
HDL: 72 mg/dL (ref >=46)
LDL Cholesterol: 172 mg/dL — ABNORMAL HIGH (ref <130)
Triglycerides: 114 mg/dL (ref <150)
VLDL: 23 mg/dL (ref <30)

## 2016-07-24 LAB — COMPLETE METABOLIC PANEL WITH GFR
ALBUMIN: 4.1 g/dL (ref 3.6–5.1)
ALT: 16 U/L (ref 6–29)
AST: 15 U/L (ref 10–35)
Alkaline Phosphatase: 134 U/L — ABNORMAL HIGH (ref 33–130)
BILIRUBIN TOTAL: 0.4 mg/dL (ref 0.2–1.2)
BUN: 14 mg/dL (ref 7–25)
CALCIUM: 9.5 mg/dL (ref 8.6–10.4)
CHLORIDE: 103 mmol/L (ref 98–110)
CO2: 26 mmol/L (ref 20–31)
CREATININE: 0.68 mg/dL (ref 0.50–1.05)
GFR, Est Non African American: >89 mL/min (ref >=60)
Glucose, Bld: 86 mg/dL (ref 65–99)
Potassium: 4.5 mmol/L (ref 3.5–5.3)
Sodium: 138 mmol/L (ref 135–146)
TOTAL PROTEIN: 6.9 g/dL (ref 6.1–8.1)

## 2016-07-24 LAB — TSH: TSH: 1.37 m[IU]/L

## 2016-07-24 NOTE — Patient Instructions (Signed)
We will send letter with your lab results. Please let us know if you don't receive this in the next 2 weeks, or if you don't hear back about a dermatology referral in the next week.

## 2016-07-24 NOTE — Progress Notes (Signed)
HPI: Roberta Livingston is a 55 y.o. female  who presents to [facility] today, 07/24/16,  for chief complaint of:  Chief Complaint  Patient presents with  . Annual Exam   Patient is accompanied by husband who assists with history-taking. Speaks Spanish, I also utilized [employer] - ID# [id number]  Concerned about mammogram and skin spots. No additional complaints, see below for preventive care.    Past medical, surgical, social and family history reviewed: Past Medical History:  Diagnosis Date  . Hyperlipidemia LDL goal < 160 07/08/2013   No past surgical history on file. Social History  Substance Use Topics  . Smoking status: Current Every Day Smoker    Packs/day: 0.50    Years: 5.00    Types: Cigarettes  . Smokeless tobacco: Not on file  . Alcohol use No   Family History  Problem Relation Age of Onset  . Hypertension Mother      Current medication list and allergy/intolerance information reviewed:   No current outpatient prescriptions on file.   No current facility-administered medications for this visit.    No Known Allergies    Review of Systems:  Constitutional:  No  fever, no chills, No recent illness, No unintentional weight changes. No significant fatigue.   HEENT: No  headache, no vision change, no hearing change, No sore throat, No  sinus pressure  Cardiac: No  chest pain, No  pressure, No palpitations, No  Orthopnea  Respiratory:  No  shortness of breath. No  Cough  Gastrointestinal: No  abdominal pain, No  nausea, No  vomiting,  No  blood in stool, No  diarrhea, No  constipation   Musculoskeletal: No new myalgia/arthralgia  Genitourinary: No  incontinence, No  abnormal genital bleeding, No abnormal genital discharge  Skin: (+) Rash/Spots, No other wounds/concerning lesions  Hem/Onc: No  easy bruising/bleeding, No  abnormal lymph node  Endocrine: No cold intolerance,  No heat intolerance. No  polyuria/polydipsia/polyphagia   Neurologic: No  weakness, No  dizziness, No  slurred speech/focal weakness/facial droop  Psychiatric: No  concerns with depression, No  concerns with anxiety, No sleep problems, No mood problems  Exam:  BP 109/63   Pulse 61   Ht 5\' 6"  (1.676 m)   Wt 143 lb (64.9 kg)   BMI 23.08 kg/m   Constitutional: VS see above. General Appearance: alert, well-developed, well-nourished, NAD  Eyes: Normal lids and conjunctive, non-icteric sclera  Ears, Nose, Mouth, Throat: MMM, Normal external inspection ears/nares/mouth/lips/gums. TM normal bilaterally. Pharynx/tonsils no erythema, no exudate. Nasal mucosa normal.   Neck: No masses, trachea midline. No thyroid enlargement. No tenderness/mass appreciated. No lymphadenopathy  Respiratory: Normal respiratory effort. no wheeze, no rhonchi, no rales  Cardiovascular: S1/S2 normal, no murmur, no rub/gallop auscultated. RRR. No lower extremity edema. Pedal pulse II/IV bilaterally DP and PT. No carotid bruit or JVD. No abdominal aortic bruit.  Gastrointestinal: Nontender, no masses. No hepatomegaly, no splenomegaly. No hernia appreciated. Bowel sounds normal. Rectal exam deferred.   Musculoskeletal: Gait normal. No clubbing/cyanosis of digits.   Neurological: No cranial nerve deficit on limited exam. Motor and sensation intact and symmetric. Cerebellar reflexes intact. Normal balance/coordination. No tremor.   Skin: warm, dry, intact. No rash/ulcer. No concerning nevi or subq nodules on limited exam.  (+) age spots on forearms, no concerning for malignancy, (+) small red patches on face <0.5cm at jawline x3, mild scaling, ?actinic changes versus urticaria  Psychiatric: Normal judgment/insight. Normal mood and affect. Oriented x3.  BREAST: No rashes/skin changes, normal fibrous breast tissue, small ?calcification palpable L breast 10:00 few centimeters from nipple, no other masses or tenderness, normal nipple without  discharge, normal axilla     ASSESSMENT/PLAN:   Annual physical exam - Plan: CBC with Differential/Platelet, COMPLETE METABOLIC PANEL WITH GFR, Hepatitis C antibody, Lipid panel, HIV antibody, TSH, VITAMIN D 25 Hydroxy (Vit-D Deficiency, Fractures), MS DIGITAL SCREENING BILATERAL  Skin spots, red - I think likely actinic changes but will reder to derm since on face, can also discuss laser or other treatments for age spots.  - Plan: Ambulatory referral to Dermatology  Breast lump in female - Mammo, consider further f/u w/ Ultrasound, feels to me like small calcification  Tobacco use disorder   FEMALE PREVENTIVE CARE  ANNUAL SCREENING/COUNSELING Tobacco - 4 cigarettes per day, x16 years Alcohol - none Diet/Exercise - HEALTHY HABITS DISCUSSED TO DECREASE CV RISK Depression - PQH2 Negative Domestic violence concerns - no HTN SCREENING - SEE VITALS Vaccination status - SEE BELOW  SEXUAL HEALTH Sexually active in the past year - yes With - Yes with female. STI - The patient denies history of sexually transmitted disease. STI testing today? - no  INFECTIOUS DISEASE SCREENING HIV - all adults 15-65 - needs GC/CT - sexually active - does not need HepC - DOB [birth year] - needs TB - does not need   DISEASE SCREENING Lipid - needs DM2 - needs Osteoporosis - does not need  CANCER SCREENING Cervical - does not need Breast - needs Lung - does not need Colon - need - explained cologuard - prefers home test  ADULT VACCINATION Influenza - was offered and declined by the patient Td - was not indicated HPV - was not indicated Zoster - was not indicated Pneumonia - was not indicated  OTHER Fall - exercise and Vit D age 2+ - does not need Consider ASA - age 53-59 - does not need   Visit summary with medication list and pertinent instructions was printed for patient to review. All questions at time of visit were answered - patient instructed to contact office with any additional  concerns. ER/RTC precautions were reviewed with the patient. Follow-up plan: Return in about 1 year (around 07/24/2017) for ANNUAL PHYSICAL.

## 2016-07-25 ENCOUNTER — Ambulatory Visit (INDEPENDENT_AMBULATORY_CARE_PROVIDER_SITE_OTHER): Payer: BLUE CROSS/BLUE SHIELD

## 2016-07-25 ENCOUNTER — Encounter: Payer: Self-pay | Admitting: Osteopathic Medicine

## 2016-07-25 DIAGNOSIS — Z1231 Encounter for screening mammogram for malignant neoplasm of breast: Secondary | ICD-10-CM

## 2016-07-25 LAB — HIV ANTIBODY (ROUTINE TESTING W REFLEX): HIV: NONREACTIVE

## 2016-07-25 LAB — VITAMIN D 25 HYDROXY (VIT D DEFICIENCY, FRACTURES): VIT D 25 HYDROXY: 32 ng/mL (ref 30–100)

## 2016-07-25 LAB — HEPATITIS C ANTIBODY: HCV AB: NEGATIVE

## 2016-08-14 ENCOUNTER — Telehealth: Payer: Self-pay

## 2016-08-14 DIAGNOSIS — Z1211 Encounter for screening for malignant neoplasm of colon: Secondary | ICD-10-CM

## 2016-08-14 NOTE — Telephone Encounter (Signed)
Cologuard ordered patient informed to call the 1888 number on form if she has not heard anythimh in 3 days. Aditya Nastasi,CMA

## 2016-09-11 DIAGNOSIS — Z1212 Encounter for screening for malignant neoplasm of rectum: Secondary | ICD-10-CM | POA: Diagnosis not present

## 2016-09-11 DIAGNOSIS — Z1211 Encounter for screening for malignant neoplasm of colon: Secondary | ICD-10-CM | POA: Diagnosis not present

## 2016-09-20 ENCOUNTER — Telehealth: Payer: Self-pay | Admitting: Osteopathic Medicine

## 2016-09-20 LAB — COLOGUARD: Cologuard: NEGATIVE

## 2016-09-20 NOTE — Telephone Encounter (Signed)
Please call patient: Reviewed cologuard results, negative. Plan for repeat colon cancer screening with cologuard in 3 years

## 2016-09-20 NOTE — Telephone Encounter (Signed)
Left detailed message on patient vm with results as noted below. Rhonda Cunningham,CMA  

## 2016-09-24 ENCOUNTER — Encounter: Payer: Self-pay | Admitting: Osteopathic Medicine

## 2017-07-04 ENCOUNTER — Emergency Department
Admission: EM | Admit: 2017-07-04 | Discharge: 2017-07-04 | Disposition: A | Discharge status: Home / Self Care | Payer: BLUE CROSS/BLUE SHIELD | Source: Home / Self Care | Attending: Emergency Medicine | Admitting: Emergency Medicine

## 2017-07-04 ENCOUNTER — Encounter: Payer: Self-pay | Admitting: *Deleted

## 2017-07-04 ENCOUNTER — Other Ambulatory Visit: Payer: Self-pay

## 2017-07-04 ENCOUNTER — Emergency Department (INDEPENDENT_AMBULATORY_CARE_PROVIDER_SITE_OTHER): Payer: BLUE CROSS/BLUE SHIELD

## 2017-07-04 DIAGNOSIS — R079 Chest pain, unspecified: Secondary | ICD-10-CM

## 2017-07-04 DIAGNOSIS — R0789 Other chest pain: Secondary | ICD-10-CM

## 2017-07-04 DIAGNOSIS — K219 Gastro-esophageal reflux disease without esophagitis: Secondary | ICD-10-CM

## 2017-07-04 DIAGNOSIS — R42 Dizziness and giddiness: Secondary | ICD-10-CM

## 2017-07-04 DIAGNOSIS — M542 Cervicalgia: Secondary | ICD-10-CM

## 2017-07-04 LAB — POCT CBC W AUTO DIFF (K'VILLE URGENT CARE)

## 2017-07-04 MED ORDER — OMEPRAZOLE 20 MG PO CPDR: 20.0000 mg | DELAYED_RELEASE_CAPSULE | Freq: Every day | ORAL | 0 refills | Status: DC

## 2017-07-04 NOTE — Discharge Instructions (Signed)
Take Prilosec twice a day, which should help with reflux. EKG, chest x-ray, and complete blood count is normal. There is no sign of a heart attack or a stroke. The chest pain, left sided neck and arm pain is likely muscular. May try heat or Tylenol to see if that helps. If any severe worsening pain, go to emergency room. Otherwise, follow-up with your PCP in one week

## 2017-07-04 NOTE — ED Triage Notes (Signed)
Pt c/o LT side head pressure with dizziness and nausea; the pain radiates to her LT neck and chest with some numbness in her LT arm. She reports having a similar pain intermittently x 2 mths. She also notes an itching rash on her face x 4 mths.

## 2017-07-04 NOTE — ED Provider Notes (Signed)
[facility]    CSN: [id number] Arrival date & time: 07/04/17  1031     History   Chief Complaint Chief Complaint  Patient presents with  . Chest Pain    HPI Roberta Livingston is a 56 y.o. female.   The history is provided by the patient (And daughter who helps interpret for her. She declined our obtaining a separate language-Spanish interpreter).  Chest Pain  Pain location:  L lateral chest and substernal area (Left lateral neck) Pain quality comment:  Pressure and soreness Pain radiates to:  L arm Pain severity:  Mild (Currently, discomfort is minimal, but can increase to a 7 or 8, with direct pressure or moving left arm) Onset quality:  Gradual Timing:  Intermittent Progression:  Waxing and waning Chronicity:  Recurrent (Over past 2 months) Context: movement and raising an arm   Context: not breathing, not eating and not trauma   Context comment:  Can be associated with reflux symptoms Relieved by: Drinking water. Worsened by:  Certain positions (Pressing on left side of neck and chest) Associated symptoms: dizziness (Vague, occasional vertigo), heartburn, nausea (Occasional nausea, currently denies nausea) and numbness (She is very vague and nonspecific about this)   Associated symptoms: no abdominal pain, no back pain, no claudication, no cough, no diaphoresis, no dysphagia, no fever, no headache, no near-syncope, no palpitations, no shortness of breath, no syncope, no vomiting and no weakness   Risk factors: no prior DVT/PE     Past Medical History:  Diagnosis Date  . Hyperlipidemia LDL goal < 160 07/08/2013    Patient Active Problem List   Diagnosis Date Noted  . Annual physical exam 07/24/2016  . Skin spots, red 07/24/2016  . Breast lump in female 07/24/2016  . Skin tag of vulva 09/29/2013  . Tobacco use disorder 09/29/2013  . Hyperlipidemia LDL goal < 160 07/08/2013  . Irregular periods 07/07/2013  . RIB PAIN, RIGHT SIDED 10/30/2010    History  reviewed. No pertinent surgical history.  OB History    No data available            Home Medications    Prior to Admission medications   Medication Sig Start Date End Date Taking? Authorizing Provider  omeprazole (PRILOSEC) 20 MG capsule Take 1 capsule (20 mg total) by mouth daily. Take 1 twice a day for 10 days. (To help with reflux) 07/04/17   Lajean ManesMassey, Shakyla Nolley, MD    Family History Family History  Problem Relation Age of Onset  . Hypertension Mother   . Heart disease Mother   . Cancer Father   . Heart disease Sister   . Cancer Brother     Social History Social History  Substance Use Topics  . Smoking status: Current Every Day Smoker    Packs/day: 0.50    Years: 5.00    Types: Cigarettes  . Smokeless tobacco: Never Used  . Alcohol use No     Allergies   Patient has no known allergies.   Review of Systems Review of Systems  Constitutional: Negative for diaphoresis and fever.  HENT: Negative for dental problem, facial swelling, sinus pain, sore throat and trouble swallowing.   Eyes: Negative for photophobia.  Respiratory: Negative for cough, shortness of breath, wheezing and stridor.   Cardiovascular: Positive for chest pain. Negative for palpitations, claudication, leg swelling, syncope and near-syncope.  Gastrointestinal: Positive for heartburn and nausea (Occasional nausea, currently denies nausea). Negative for abdominal pain and vomiting.  Genitourinary: Negative.   Musculoskeletal:  Positive for myalgias (Left shoulder at times) and neck pain (Left side of neck, worse with turning neck.). Negative for back pain and neck stiffness.  Skin: Negative for color change and wound.  Neurological: Positive for dizziness (Vague, occasional vertigo) and numbness (She is very vague and nonspecific about this). Negative for weakness and headaches.  Hematological: Negative for adenopathy.  Psychiatric/Behavioral: Negative for confusion and hallucinations.     Physical  Exam Triage Vital Signs ED Triage Vitals  Enc Vitals Group     BP      Pulse      Resp      Temp      Temp src      SpO2      Weight      Height      Head Circumference      Peak Flow      Pain Score      Pain Loc      Pain Edu?      Excl. in GC?    No data found.   Updated Vital Signs BP 122/65 (BP Location: Left Arm)   Pulse 62   Temp 98.6 F (37 C) (Oral)   Resp 16   Ht 5\' 4"  (1.626 m)   Wt 153 lb (69.4 kg)   LMP 05/29/2013   SpO2 98%   BMI 26.26 kg/m   Visual Acuity Right Eye Distance:   Left Eye Distance:   Bilateral Distance:    Right Eye Near:   Left Eye Near:    Bilateral Near:     Physical Exam  Constitutional: She is oriented to person, place, and time. She appears well-developed and well-nourished. No distress.  HENT:  Head: Normocephalic and atraumatic.  Right Ear: Tympanic membrane and ear canal normal.  Left Ear: Tympanic membrane and ear canal normal.  Nose: No rhinorrhea or sinus tenderness. Right sinus exhibits no maxillary sinus tenderness and no frontal sinus tenderness. Left sinus exhibits no maxillary sinus tenderness and no frontal sinus tenderness.  Mouth/Throat: Oropharynx is clear and moist. No oropharyngeal exudate.  No oral lesions  Eyes: Pupils are equal, round, and reactive to light. No scleral icterus.  EOMI, PERRL, fundi normal. No nystagmus  Neck: Normal range of motion. Neck supple. Normal carotid pulses and no JVD present. Muscular tenderness (Left lateral neck) present. Carotid bruit is not present. No neck rigidity. No tracheal deviation present. No thyroid mass present.    Cardiovascular: Normal rate, regular rhythm and intact distal pulses.  Exam reveals no gallop and no friction rub.   No murmur heard.   Pulmonary/Chest: Effort normal. No stridor.  Abdominal: She exhibits no distension.  Musculoskeletal: Normal range of motion.  Full range of motion, but some mild tenderness to palpation over deltoid muscles. No  bony tenderness. Left upper extremity exam otherwise normal  Neurological: She is alert and oriented to person, place, and time. She displays normal reflexes. No cranial nerve deficit or sensory deficit. She exhibits normal muscle tone. Coordination normal.  Motor and sensory upper extremities normal. DTRs 2+ and equal bilaterally. Sensation intact  Skin: Skin is warm and dry. Capillary refill takes less than 2 seconds. She is not diaphoretic.  Psychiatric: She has a normal mood and affect. Her behavior is normal.  Vitals reviewed.    UC Treatments / Results  Labs (all labs ordered are listed, but only abnormal results are displayed) Labs Reviewed  POCT CBC W AUTO DIFF ([facility])    EKG  EKG Interpretation None     EKG: Normal sinus rhythm. No ST abnormalities. No ectopy. Normal EKG.  Radiology Dg Chest 2 View  Result Date: 07/04/2017 CLINICAL DATA:  Intermittent chest pain for 2 months EXAM: CHEST  2 VIEW COMPARISON:  01/24/2016 FINDINGS: The heart size and mediastinal contours are within normal limits. Both lungs are clear. The visualized skeletal structures are unremarkable. IMPRESSION: No active cardiopulmonary disease. Electronically Signed   By: Alcide Clever M.D.   On: 07/04/2017 11:42    Procedures Procedures (including critical care time)  Medications Ordered in UC Medications - No data to display   Initial Impression / Assessment and Plan / UC Course  I have reviewed the triage vital signs and the nursing notes.  Pertinent labs & imaging results that were available during my care of the patient were reviewed by me and considered in my medical decision making (see chart for details).   CBC normal. CBC 6.2, hemoglobin 14.2, platelets 263,000    Final Clinical Impressions(s) / UC Diagnoses   Final diagnoses:  Chest wall pain  Neck pain on left side  Dizziness and giddiness  Gastroesophageal reflux disease without esophagitis  No evidence of acute  neurologic event or acute cardiorespiratory event. Explained at length to patient and daughter. An After Visit Summary was printed and given to the patient. Information on costochondritis and reflux given in written form. Follow-up with your primary care doctor in 7 days if not improving, or sooner if symptoms become worse. Precautions discussed. Red flags discussed. Questions invited and answered. They voiced understanding and agreement.   New Prescriptions New Prescriptions   OMEPRAZOLE (PRILOSEC) 20 MG CAPSULE    Take 1 capsule (20 mg total) by mouth daily. Take 1 twice a day for 10 days. (To help with reflux)      Lajean Manes, MD 07/04/17 1200

## 2017-07-06 ENCOUNTER — Telehealth: Payer: Self-pay

## 2017-07-06 NOTE — Telephone Encounter (Signed)
Left a message on voice mail asking how patient is feeling and advising to call back with any questions or concerns.  

## 2017-09-10 ENCOUNTER — Encounter: Payer: Self-pay | Admitting: Osteopathic Medicine

## 2017-09-10 ENCOUNTER — Ambulatory Visit (INDEPENDENT_AMBULATORY_CARE_PROVIDER_SITE_OTHER): Payer: BLUE CROSS/BLUE SHIELD | Admitting: Osteopathic Medicine

## 2017-09-10 VITALS — BP 124/77 | HR 69 | Wt 150.0 lb

## 2017-09-10 DIAGNOSIS — K921 Melena: Secondary | ICD-10-CM

## 2017-09-10 DIAGNOSIS — R1032 Left lower quadrant pain: Secondary | ICD-10-CM | POA: Diagnosis not present

## 2017-09-10 DIAGNOSIS — N6452 Nipple discharge: Secondary | ICD-10-CM | POA: Diagnosis not present

## 2017-09-10 DIAGNOSIS — R6881 Early satiety: Secondary | ICD-10-CM | POA: Insufficient documentation

## 2017-09-10 DIAGNOSIS — R1012 Left upper quadrant pain: Secondary | ICD-10-CM | POA: Diagnosis not present

## 2017-09-10 NOTE — Patient Instructions (Addendum)
Plan:  I think bloody stools may be due to internal hemorrhoids or a bleeding diverticula. If there was diarrhea, I would be more suspicious of an infection, but since there is pain I think we should get a CT scan of the abdomen to be safe. If your pain gets worse, please go to a hospital emergency room.   For the breast issue, usually nipple discharge on both sides is less suspicious for cancer but we should get an updated mammogram and ultrasound of the breast tissue. You should get a call from the [facility] in [place] to schedule these tests.    We are getting lab work today to look for other causes of abdominal pain and nipple discharge  I am sending you to a GI doctor as well. This is a specialist who treats the stomach and intestines. I think you may need a colonoscopy or an upper endoscopy to look at the colon and the stomach.

## 2017-09-10 NOTE — Progress Notes (Signed)
HPI: Roberta Livingston is a 56 y.o. female  who presents to [facility] today, 09/10/17,  for chief complaint of:  Chief Complaint  Patient presents with  . Blood In Stools    Bloody stool: Bright red blood, only present with bowel movements, bowel movements have been normal and about every other day. This problem has been ongoing for about 5 or 6 days now. Associated with abdominal pain left lower quadrant, no diarrhea or fever. Reports several months of epigastric pain and early satiety/low appetite. No history of hemorrhoids. No vaginal bleeding. Feels tired.  Nipple discharge: Bilateral. Left is a bit worse, thicker/yellowish. Associated with some tender area in the 8 to 9:00 position of the left breast. There is clearish discharge from the right nipple. This problem has been ongoing for about 2.5 months.   Negative Cologuard - 08/2016 Hgb 11.8 - 06/2016 in urgent care  Mammogram Birads 1 - 06/2016  Patient is accompanied by daughter who assists with history-taking.   Past medical, surgical, social and family history reviewed: Patient Active Problem List   Diagnosis Date Noted  . Annual physical exam 07/24/2016  . Skin spots, red 07/24/2016  . Breast lump in female 07/24/2016  . Skin tag of vulva 09/29/2013  . Tobacco use disorder 09/29/2013  . Hyperlipidemia LDL goal < 160 07/08/2013  . Irregular periods 07/07/2013  . RIB PAIN, RIGHT SIDED 10/30/2010   No past surgical history on file. Social History  Substance Use Topics  . Smoking status: Current Every Day Smoker    Packs/day: 0.50    Years: 5.00    Types: Cigarettes  . Smokeless tobacco: Never Used  . Alcohol use No   Family History  Problem Relation Age of Onset  . Hypertension Mother   . Heart disease Mother   . Cancer Father   . Heart disease Sister   . Cancer Brother      Current medication list and allergy/intolerance information reviewed:   Current Outpatient  Prescriptions  Medication Sig Dispense Refill  . omeprazole (PRILOSEC) 20 MG capsule Take 1 capsule (20 mg total) by mouth daily. Take 1 twice a day for 10 days. (To help with reflux) 20 capsule 0   No current facility-administered medications for this visit.    No Known Allergies    Review of Systems:  Constitutional:  No  fever, no chills, No unintentional weight changes. +significant fatigue.   HEENT: No  headache  Cardiac: No  chest pain, No  pressure, No palpitations,   Respiratory:  No  shortness of breath.   Gastrointestinal: +abdominal pain, No  nausea, No  vomiting,  +blood in stool, No  diarrhea, No  constipation   Musculoskeletal: No new myalgia/arthralgia  Genitourinary: No  incontinence, No  abnormal genital bleeding, No abnormal genital discharge  Skin: No  Rash,   Neurologic: No  weakness, No  dizziness  Psychiatric: No  concerns with depression, No  concerns with anxiety  Exam:  BP 124/77   Pulse 69   Wt 150 lb (68 kg)   LMP 05/29/2013   BMI 25.75 kg/m   Constitutional: VS see above. General Appearance: alert, well-developed, well-nourished, NAD  Eyes: Normal lids and conjunctive, non-icteric sclera  Ears, Nose, Mouth, Throat: MMM, Normal external inspection ears/nares/mouth/lips/gums.   Neck: No masses, trachea midline. No thyroid enlargement.   Respiratory: Normal respiratory effort. no wheeze, no rhonchi, no rales  Cardiovascular: S1/S2 normal, no murmur, no rub/gallop auscultated. RRR. No lower extremity  edema.  Gastrointestinal: (+)TTP epigastric, LUQ, LLQ, no masses. No hepatomegaly, no splenomegaly. No hernia appreciated. Bowel sounds normal. Rectal exam normal tone and no blood or mass. NO stool on glove.   Musculoskeletal: Gait normal.  Skin: warm, dry, intact. No rash/ulcer.   Psychiatric: Normal judgment/insight. Normal mood and affect. Oriented x3.    ASSESSMENT/PLAN:   Bloody stools - Plan: CBC, COMPLETE METABOLIC PANEL WITH  GFR, Ambulatory referral to Gastroenterology  Nipple discharge - Plan: Prolactin, TSH, MM Digital Diagnostic Bilat, US BREAST COMPLETE UNI LEFT INC AXILLA, US BREAST COMPLETE UNI RIGHT INC AXILLA  Left lower quadrant pain - Plan: CBC, COMPLETE METABOLIC PANEL WITH GFR, Lipase, CT Abdomen Pelvis W Contrast, Ambulatory referral to Gastroenterology  Left upper quadrant pain - Plan: CBC, COMPLETE METABOLIC PANEL WITH GFR, Lipase, CT Abdomen Pelvis W Contrast, Ambulatory referral to Gastroenterology  Early satiety - Plan: Ambulatory referral to Gastroenterology    Patient Instructions  Plan:  I think bloody stools may be due to internal hemorrhoids or a bleeding diverticula. If there was diarrhea, I would be more suspicious of an infection, but since there is pain I think we should get a CT scan of the abdomen to be safe. If your pain gets worse, please go to a hospital emergency room.   For the breast issue, usually nipple discharge on both sides is less suspicious for cancer but we should get an updated mammogram and ultrasound of the breast tissue. You should get a call from the [facility] in [place] to schedule these tests.    We are getting lab work today to look for other causes of abdominal pain and nipple discharge  I am sending you to a GI doctor as well. This is a specialist who treats the stomach and intestines. I think you may need a colonoscopy or an upper endoscopy to look at the colon and the stomach.      Visit summary with medication list and pertinent instructions was printed for patient to review. All questions at time of visit were answered - patient instructed to contact office with any additional concerns. ER/RTC precautions were reviewed with the patient. Follow-up plan: Return if symptoms worsen or fail to improve, for recheck symptoms after CT stan - see me in 2 or 3 days .  Note: Total time spent 40 minutes, greater than 50% of the visit was spent face-to-face  counseling and coordinating care for the following: The primary encounter diagnosis was Bloody stools. Diagnoses of Nipple discharge, Left lower quadrant pain, Left upper quadrant pain, and Early satiety were also pertinent to this visit.Marland Kitchen

## 2017-09-11 ENCOUNTER — Ambulatory Visit (INDEPENDENT_AMBULATORY_CARE_PROVIDER_SITE_OTHER): Payer: BLUE CROSS/BLUE SHIELD

## 2017-09-11 DIAGNOSIS — K5790 Diverticulosis of intestine, part unspecified, without perforation or abscess without bleeding: Secondary | ICD-10-CM

## 2017-09-11 DIAGNOSIS — K625 Hemorrhage of anus and rectum: Secondary | ICD-10-CM | POA: Diagnosis not present

## 2017-09-11 DIAGNOSIS — R1013 Epigastric pain: Secondary | ICD-10-CM | POA: Diagnosis not present

## 2017-09-11 DIAGNOSIS — R6881 Early satiety: Secondary | ICD-10-CM | POA: Diagnosis not present

## 2017-09-11 DIAGNOSIS — R634 Abnormal weight loss: Secondary | ICD-10-CM | POA: Diagnosis not present

## 2017-09-11 DIAGNOSIS — K5730 Diverticulosis of large intestine without perforation or abscess without bleeding: Secondary | ICD-10-CM | POA: Diagnosis not present

## 2017-09-11 LAB — COMPLETE METABOLIC PANEL WITH GFR
AG Ratio: 1.6 (calc) (ref 1.0–2.5)
ALT: 11 U/L (ref 6–29)
AST: 15 U/L (ref 10–35)
Albumin: 4.1 g/dL (ref 3.6–5.1)
Alkaline phosphatase (APISO): 114 U/L (ref 33–130)
BILIRUBIN TOTAL: 0.3 mg/dL (ref 0.2–1.2)
BUN: 16 mg/dL (ref 7–25)
CHLORIDE: 105 mmol/L (ref 98–110)
CO2: 27 mmol/L (ref 20–32)
Calcium: 9.2 mg/dL (ref 8.6–10.4)
Creat: 0.81 mg/dL (ref 0.50–1.05)
GFR, Est African American: 95 mL/min/{1.73_m2} (ref >=60)
GFR, Est Non African American: 82 mL/min/{1.73_m2} (ref >=60)
GLOBULIN: 2.5 g/dL (ref 1.9–3.7)
GLUCOSE: 95 mg/dL (ref 65–99)
Potassium: 4 mmol/L (ref 3.5–5.3)
Sodium: 140 mmol/L (ref 135–146)
Total Protein: 6.6 g/dL (ref 6.1–8.1)

## 2017-09-11 LAB — CBC
HCT: 42.6 % (ref 35.0–45.0)
Hemoglobin: 14.4 g/dL (ref 11.7–15.5)
MCH: 28.3 pg (ref 27.0–33.0)
MCHC: 33.8 g/dL (ref 32.0–36.0)
MCV: 83.7 fL (ref 80.0–100.0)
MPV: 10.9 fL (ref 7.5–12.5)
Platelets: 299 10*3/uL (ref 140–400)
RBC: 5.09 10*6/uL (ref 3.80–5.10)
RDW: 13 % (ref 11.0–15.0)
WBC: 7.3 10*3/uL (ref 3.8–10.8)

## 2017-09-11 LAB — LIPASE: LIPASE: 24 U/L (ref 7–60)

## 2017-09-11 LAB — PROLACTIN: Prolactin: 6.7 ng/mL

## 2017-09-11 LAB — TSH: TSH: 1.26 m[IU]/L

## 2017-09-11 MED ORDER — IOPAMIDOL (ISOVUE-300) INJECTION 61%
100.0000 mL | Freq: Once | INTRAVENOUS | Status: AC | PRN
  Administered 2017-09-11: 100 mL via INTRAVENOUS

## 2017-09-13 DIAGNOSIS — K3189 Other diseases of stomach and duodenum: Secondary | ICD-10-CM | POA: Diagnosis not present

## 2017-09-13 DIAGNOSIS — R1013 Epigastric pain: Secondary | ICD-10-CM | POA: Diagnosis not present

## 2017-09-13 DIAGNOSIS — K921 Melena: Secondary | ICD-10-CM | POA: Diagnosis not present

## 2017-09-13 DIAGNOSIS — R634 Abnormal weight loss: Secondary | ICD-10-CM | POA: Diagnosis not present

## 2017-09-13 DIAGNOSIS — R6881 Early satiety: Secondary | ICD-10-CM | POA: Diagnosis not present

## 2017-09-20 ENCOUNTER — Ambulatory Visit
Admission: RE | Admit: 2017-09-20 | Discharge: 2017-09-20 | Disposition: A | Discharge status: Home / Self Care | Payer: BLUE CROSS/BLUE SHIELD | Source: Ambulatory Visit | Attending: Osteopathic Medicine | Admitting: Osteopathic Medicine

## 2017-09-20 ENCOUNTER — Ambulatory Visit: Admission: RE | Admit: 2017-09-20 | Payer: BLUE CROSS/BLUE SHIELD | Source: Ambulatory Visit

## 2017-09-20 ENCOUNTER — Ambulatory Visit: Payer: BLUE CROSS/BLUE SHIELD

## 2017-09-20 DIAGNOSIS — R928 Other abnormal and inconclusive findings on diagnostic imaging of breast: Secondary | ICD-10-CM | POA: Diagnosis not present

## 2017-12-02 DIAGNOSIS — K625 Hemorrhage of anus and rectum: Secondary | ICD-10-CM | POA: Diagnosis not present

## 2017-12-02 DIAGNOSIS — R1013 Epigastric pain: Secondary | ICD-10-CM | POA: Diagnosis not present

## 2018-07-21 ENCOUNTER — Ambulatory Visit (HOSPITAL_BASED_OUTPATIENT_CLINIC_OR_DEPARTMENT_OTHER)
Admission: RE | Admit: 2018-07-21 | Discharge: 2018-07-21 | Disposition: A | Discharge status: Home / Self Care | Payer: BLUE CROSS/BLUE SHIELD | Source: Ambulatory Visit | Attending: Family Medicine | Admitting: Family Medicine

## 2018-07-21 ENCOUNTER — Other Ambulatory Visit: Payer: Self-pay

## 2018-07-21 ENCOUNTER — Ambulatory Visit (HOSPITAL_BASED_OUTPATIENT_CLINIC_OR_DEPARTMENT_OTHER): Payer: BLUE CROSS/BLUE SHIELD

## 2018-07-21 ENCOUNTER — Emergency Department
Admission: EM | Admit: 2018-07-21 | Discharge: 2018-07-21 | Disposition: A | Discharge status: Home / Self Care | Payer: BLUE CROSS/BLUE SHIELD | Source: Home / Self Care | Attending: Family Medicine | Admitting: Family Medicine

## 2018-07-21 ENCOUNTER — Encounter: Payer: Self-pay | Admitting: Emergency Medicine

## 2018-07-21 ENCOUNTER — Other Ambulatory Visit (HOSPITAL_BASED_OUTPATIENT_CLINIC_OR_DEPARTMENT_OTHER): Payer: Self-pay | Admitting: Family Medicine

## 2018-07-21 DIAGNOSIS — M545 Low back pain, unspecified: Secondary | ICD-10-CM

## 2018-07-21 DIAGNOSIS — M47896 Other spondylosis, lumbar region: Secondary | ICD-10-CM | POA: Diagnosis not present

## 2018-07-21 LAB — POCT URINALYSIS DIP (MANUAL ENTRY)
BILIRUBIN UA: NEGATIVE
Glucose, UA: NEGATIVE mg/dL
Ketones, POC UA: NEGATIVE mg/dL
NITRITE UA: NEGATIVE
PH UA: 6 (ref 5.0–8.0)
Protein Ur, POC: NEGATIVE mg/dL
SPEC GRAV UA: 1.01 (ref 1.010–1.025)
UROBILINOGEN UA: 0.2 U/dL

## 2018-07-21 MED ORDER — HYDROCODONE-ACETAMINOPHEN 5-325 MG PO TABS: 1.0000 | ORAL_TABLET | Freq: Four times a day (QID) | ORAL | 0 refills | Status: DC | PRN

## 2018-07-21 MED ORDER — PREDNISONE 20 MG PO TABS: ORAL_TABLET | ORAL | 0 refills | Status: DC

## 2018-07-21 MED ORDER — KETOROLAC TROMETHAMINE 60 MG/2ML IM SOLN
60.0000 mg | Freq: Once | INTRAMUSCULAR | Status: AC
  Administered 2018-07-21: 60 mg via INTRAMUSCULAR

## 2018-07-21 NOTE — ED Triage Notes (Signed)
Low back pain radiates into lower left abdomin, started yesterday

## 2018-07-21 NOTE — Discharge Instructions (Signed)
Apply ice pack for 20 to 30 minutes, 3 to 4 times daily  Continue until pain and swelling decrease.  °

## 2018-07-21 NOTE — ED Provider Notes (Signed)
[facility]    CSN: [id number] Arrival date & time: 07/21/18  [id number]     History   Chief Complaint Chief Complaint  Patient presents with  . Back Pain    HPI Roberta Livingston is a 57 y.o. female.   Patient is Spanish speaking and husband serves as Engineer, technical salesinterpretor. Yesterday at 7:30pm while taking a shower, patient suddenly developed left lower back pain which began to radiate to her left lower abdomen and inguinal area.   She denies bowel or bladder dysfunction, and no saddle numbness.  No urinary symptoms.  No nausea/vomiting.  No fevers, chills, and sweats.  Her pain is worse with movement from sitting to standing.  Pain is slightly improved with Aleve.  She recalls no injury or change in activities.  The history is provided by the patient and the spouse. The history is limited by a language barrier. No language interpreter was used.  Back Pain  Location:  Lumbar spine Quality:  Aching Radiates to: Left inguinal area. Pain severity:  Moderate Pain is:  Same all the time Onset quality:  Sudden Duration:  16 weeks Timing:  Constant Progression:  Unchanged Chronicity:  New Context: physical stress   Context: not lifting heavy objects, not recent injury and not twisting   Relieved by:  Nothing Exacerbated by: standing up from a chair. Ineffective treatments:  NSAIDs Associated symptoms: abdominal pain   Associated symptoms: no abdominal swelling, no bladder incontinence, no bowel incontinence, no chest pain, no dysuria, no fever, no headaches, no leg pain, no numbness, no paresthesias, no pelvic pain, no perianal numbness, no tingling, no weakness and no weight loss     Past Medical History:  Diagnosis Date  . Hyperlipidemia LDL goal < 160 07/08/2013    Patient Active Problem List   Diagnosis Date Noted  . Bloody stools 09/10/2017  . Nipple discharge 09/10/2017  . Early satiety 09/10/2017  . Annual physical exam 07/24/2016  . Skin spots, red 07/24/2016  . Breast  lump in female 07/24/2016  . Skin tag of vulva 09/29/2013  . Tobacco use disorder 09/29/2013  . Hyperlipidemia LDL goal < 160 07/08/2013  . Irregular periods 07/07/2013  . RIB PAIN, RIGHT SIDED 10/30/2010    History reviewed. No pertinent surgical history.  OB History   None      Home Medications    Prior to Admission medications   Medication Sig Start Date End Date Taking? Authorizing Provider  naproxen sodium (ALEVE) 220 MG tablet Take 220 mg by mouth.   Yes [provider]  HYDROcodone-acetaminophen (NORCO/VICODIN) 5-325 MG tablet Take 1 tablet by mouth every 6 (six) hours as needed for moderate pain. 07/21/18   Lattie HawBeese, [place] Moss A, MD  predniSONE (DELTASONE) 20 MG tablet Take one tab by mouth twice daily for 4 days, then one daily for 3 days. Take with food. 07/21/18   Lattie HawBeese, Jenan Ellegood A, MD    Family History Family History  Problem Relation Age of Onset  . Hypertension Mother   . Heart disease Mother   . Cancer Father   . Heart disease Sister   . Cancer Brother     Social History Social History   Tobacco Use  . Smoking status: Current Every Day Smoker    Packs/day: 0.50    Years: 5.00    Pack years: 2.50    Types: Cigarettes  . Smokeless tobacco: Never Used  Substance Use Topics  . Alcohol use: No  . Drug use: No  Allergies   Patient has no known allergies.   Review of Systems Review of Systems  Constitutional: Negative for fever and weight loss.  Cardiovascular: Negative for chest pain.  Gastrointestinal: Positive for abdominal pain. Negative for bowel incontinence.  Genitourinary: Negative for bladder incontinence, dysuria and pelvic pain.  Musculoskeletal: Positive for back pain.  Neurological: Negative for tingling, weakness, numbness, headaches and paresthesias.  All other systems reviewed and are negative.    Physical Exam Triage Vital Signs ED Triage Vitals  Enc Vitals Group     BP 07/21/18 0948 126/76     Pulse Rate 07/21/18  0948 63     Resp --      Temp 07/21/18 0948 98.2 F (36.8 C)     Temp Source 07/21/18 0948 Oral     SpO2 07/21/18 0948 99 %     Weight 07/21/18 0949 151 lb (68.5 kg)     Height 07/21/18 0949 5\' 4"  (1.626 m)     Head Circumference --      Peak Flow --      Pain Score 07/21/18 0949 10     Pain Loc --      Pain Edu? --      Excl. in GC? --    No data found.  Updated Vital Signs BP 126/76 (BP Location: Right Arm)   Pulse 63   Temp 98.2 F (36.8 C) (Oral)   Ht 5\' 4"  (1.626 m)   Wt 68.5 kg   LMP 05/29/2013   SpO2 99%   BMI 25.92 kg/m   Visual Acuity Right Eye Distance:   Left Eye Distance:   Bilateral Distance:    Right Eye Near:   Left Eye Near:    Bilateral Near:     Physical Exam  Constitutional: She appears well-developed and well-nourished. No distress.  HENT:  Head: Normocephalic.  Right Ear: External ear normal.  Left Ear: External ear normal.  Nose: Nose normal.  Mouth/Throat: Oropharynx is clear and moist.  Eyes: Pupils are equal, round, and reactive to light. Conjunctivae are normal.  Neck: Normal range of motion.  Cardiovascular: Normal heart sounds.  Pulmonary/Chest: Breath sounds normal.  Abdominal: Soft. Normal appearance and bowel sounds are normal. There is no tenderness at McBurney's point.    There is mild tenderness to palpation over the left inguinal area as noted on diagram.   Musculoskeletal:       Lumbar back: She exhibits decreased range of motion, tenderness and bony tenderness.       Back:  Back:   Decreased range of motion. Can heel/toe walk and squat without difficulty.  Tenderness in the   right paraspinous muscles from L3 to Sacral area.  Straight leg raising test is negative.  Sitting knee extension test is negative.  Strength and sensation in the lower extremities is normal.  Patellar and achilles reflexes are normal.  Hips have normal range of motion.  There is tenderness to palpation over the left greater trochanter.    Neurological: She is alert.  Skin: Skin is warm and dry.  Nursing note and vitals reviewed.    UC Treatments / Results  Labs (all labs ordered are listed, but only abnormal results are displayed) Labs Reviewed  POCT URINALYSIS DIP (MANUAL ENTRY) - Abnormal; Notable for the following components:      Result Value   Clarity, UA cloudy (*)    Blood, UA small (*)    Leukocytes, UA Small (1+) (*)    All other  components within normal limits  URINE CULTURE    EKG None  Radiology Dg Lumbar Spine Complete  Result Date: 07/21/2018 CLINICAL DATA:  Low back pain radiating to the left, initial encounter EXAM: LUMBAR SPINE - COMPLETE 4+ VIEW COMPARISON:  None. FINDINGS: Five lumbar type vertebral bodies are well visualized. Vertebral body height is well maintained. No pars defects are seen. Mild facet hypertrophic changes are noted. No anterolisthesis is noted. No soft tissue changes are seen. IMPRESSION: Mild degenerative change without acute abnormality. Electronically Signed   By: Alcide Clever M.D.   On: 07/21/2018 12:33    Procedures Procedures (including critical care time)  Medications Ordered in UC Medications  ketorolac (TORADOL) injection 60 mg (60 mg Intramuscular Given 07/21/18 1024)    Initial Impression / Assessment and Plan / UC Course  I have reviewed the triage vital signs and the nursing notes.  Pertinent labs & imaging results that were available during my care of the patient were reviewed by me and considered in my medical decision making (see chart for details).    Administered Toradol 60mg  IM, with resolution of patient's pain. Suspect left sided lumbosacral strain.  Begin prednisone burst/taper. Rx for Lortab (#12, no refill) Controlled Substance Prescriptions I have consulted the [place] Controlled Substances Registry for this patient, and feel the risk/benefit ratio today is favorable for proceeding with this prescription for a controlled substance.   Final  Clinical Impressions(s) / UC Diagnoses   Final diagnoses:  Acute left-sided low back pain without sciatica     Discharge Instructions     Apply ice pack for 20 to 30 minutes, 3 to 4 times daily  Continue until pain and swelling decrease.     ED Prescriptions    Medication Sig Dispense Auth. Provider   HYDROcodone-acetaminophen (NORCO/VICODIN) 5-325 MG tablet Take 1 tablet by mouth every 6 (six) hours as needed for moderate pain. 12 tablet Lattie Haw, MD   predniSONE (DELTASONE) 20 MG tablet Take one tab by mouth twice daily for 4 days, then one daily for 3 days. Take with food. 11 tablet Lattie Haw, MD         Lattie Haw, MD 07/21/18 [phone]

## 2018-07-23 ENCOUNTER — Telehealth: Payer: Self-pay | Admitting: Emergency Medicine

## 2018-07-23 LAB — URINE CULTURE
MICRO NUMBER:: 91016191
SPECIMEN QUALITY: ADEQUATE

## 2018-07-23 NOTE — Telephone Encounter (Signed)
Labs normal, hope she is feeling better

## 2018-10-17 ENCOUNTER — Telehealth: Payer: Self-pay | Admitting: Osteopathic Medicine

## 2018-10-17 NOTE — Telephone Encounter (Signed)
Left VM for Pt to return clinic call.  

## 2018-10-17 NOTE — Telephone Encounter (Signed)
Patient's last visit was 08/2017  Our records indicate that patient is not up to date on annual physical or Pap / Mammogram.   Can we call her to set up an annual wellness physical sometime next few months?

## 2019-06-09 DIAGNOSIS — Z20828 Contact with and (suspected) exposure to other viral communicable diseases: Secondary | ICD-10-CM | POA: Diagnosis not present

## 2019-10-05 ENCOUNTER — Other Ambulatory Visit: Payer: Self-pay | Admitting: Osteopathic Medicine

## 2019-10-05 DIAGNOSIS — Z1231 Encounter for screening mammogram for malignant neoplasm of breast: Secondary | ICD-10-CM

## 2019-12-16 ENCOUNTER — Other Ambulatory Visit: Payer: Self-pay

## 2019-12-16 ENCOUNTER — Ambulatory Visit (INDEPENDENT_AMBULATORY_CARE_PROVIDER_SITE_OTHER): Payer: BC Managed Care – PPO

## 2019-12-16 DIAGNOSIS — Z1231 Encounter for screening mammogram for malignant neoplasm of breast: Secondary | ICD-10-CM | POA: Diagnosis not present

## 2019-12-21 ENCOUNTER — Ambulatory Visit (INDEPENDENT_AMBULATORY_CARE_PROVIDER_SITE_OTHER): Payer: BC Managed Care – PPO | Admitting: Osteopathic Medicine

## 2019-12-21 ENCOUNTER — Other Ambulatory Visit: Payer: Self-pay

## 2019-12-21 ENCOUNTER — Encounter: Payer: Self-pay | Admitting: Osteopathic Medicine

## 2019-12-21 ENCOUNTER — Other Ambulatory Visit (HOSPITAL_COMMUNITY)
Admission: RE | Admit: 2019-12-21 | Discharge: 2019-12-21 | Disposition: A | Discharge status: Home / Self Care | Payer: BC Managed Care – PPO | Source: Ambulatory Visit | Attending: Osteopathic Medicine | Admitting: Osteopathic Medicine

## 2019-12-21 VITALS — BP 111/62 | HR 72 | Temp 98.4°F | Ht 63.0 in | Wt 164.0 lb

## 2019-12-21 DIAGNOSIS — Z0000 Encounter for general adult medical examination without abnormal findings: Secondary | ICD-10-CM | POA: Insufficient documentation

## 2019-12-21 DIAGNOSIS — Z2821 Immunization not carried out because of patient refusal: Secondary | ICD-10-CM | POA: Diagnosis not present

## 2019-12-21 DIAGNOSIS — Z124 Encounter for screening for malignant neoplasm of cervix: Secondary | ICD-10-CM

## 2019-12-21 DIAGNOSIS — Z1211 Encounter for screening for malignant neoplasm of colon: Secondary | ICD-10-CM | POA: Diagnosis not present

## 2019-12-21 DIAGNOSIS — Z23 Encounter for immunization: Secondary | ICD-10-CM | POA: Diagnosis not present

## 2019-12-21 DIAGNOSIS — E785 Hyperlipidemia, unspecified: Secondary | ICD-10-CM | POA: Diagnosis not present

## 2019-12-21 DIAGNOSIS — K219 Gastro-esophageal reflux disease without esophagitis: Secondary | ICD-10-CM | POA: Diagnosis not present

## 2019-12-21 LAB — COMPLETE METABOLIC PANEL WITH GFR
AG Ratio: 1.6 (calc) (ref 1.0–2.5)
ALT: 13 U/L (ref 6–29)
AST: 14 U/L (ref 10–35)
Albumin: 4.1 g/dL (ref 3.6–5.1)
Alkaline phosphatase (APISO): 114 U/L (ref 37–153)
BUN: 14 mg/dL (ref 7–25)
CO2: 27 mmol/L (ref 20–32)
Calcium: 9.3 mg/dL (ref 8.6–10.4)
Chloride: 105 mmol/L (ref 98–110)
Creat: 0.59 mg/dL (ref 0.50–1.05)
GFR, Est African American: 119 mL/min/{1.73_m2} (ref >=60)
GFR, Est Non African American: 102 mL/min/{1.73_m2} (ref >=60)
Globulin: 2.5 g/dL (calc) (ref 1.9–3.7)
Glucose, Bld: 83 mg/dL (ref 65–99)
Potassium: 4.3 mmol/L (ref 3.5–5.3)
Sodium: 141 mmol/L (ref 135–146)
Total Bilirubin: 0.4 mg/dL (ref 0.2–1.2)
Total Protein: 6.6 g/dL (ref 6.1–8.1)

## 2019-12-21 LAB — LIPID PANEL
Cholesterol: 253 mg/dL — ABNORMAL HIGH (ref <200)
HDL: 67 mg/dL (ref >=50)
LDL Cholesterol (Calc): 162 mg/dL (calc) — ABNORMAL HIGH
Non-HDL Cholesterol (Calc): 186 mg/dL (calc) — ABNORMAL HIGH (ref <130)
Total CHOL/HDL Ratio: 3.8 (calc) (ref <5.0)
Triglycerides: 118 mg/dL (ref <150)

## 2019-12-21 LAB — CBC
HCT: 42.9 % (ref 35.0–45.0)
Hemoglobin: 14.2 g/dL (ref 11.7–15.5)
MCH: 28.4 pg (ref 27.0–33.0)
MCHC: 33.1 g/dL (ref 32.0–36.0)
MCV: 85.8 fL (ref 80.0–100.0)
MPV: 10.7 fL (ref 7.5–12.5)
Platelets: 305 10*3/uL (ref 140–400)
RBC: 5 10*6/uL (ref 3.80–5.10)
RDW: 12.8 % (ref 11.0–15.0)
WBC: 5.8 10*3/uL (ref 3.8–10.8)

## 2019-12-21 MED ORDER — FAMOTIDINE 20 MG PO TABS: 20.0000 mg | ORAL_TABLET | Freq: Every day | ORAL | 3 refills | Status: DC

## 2019-12-21 MED ORDER — PANTOPRAZOLE SODIUM 40 MG PO TBEC: 40.0000 mg | DELAYED_RELEASE_TABLET | Freq: Every day | ORAL | 0 refills | Status: DC

## 2019-12-21 NOTE — Addendum Note (Signed)
Addended by: Arvilla Market on: 12/21/2019 09:51 AM   Modules accepted: Orders

## 2019-12-21 NOTE — Patient Instructions (Addendum)
General Preventive Care  Most recent routine blood work: ordered today  Everyone should have blood pressure checked once per year.   Tobacco: don't! Please let me know if you need help quitting!  Alcohol: responsible moderation is ok for most adults - if you have concerns about your alcohol intake, please talk to me!   Exercise: as tolerated to reduce risk of cardiovascular disease and diabetes. Strength training will also prevent osteoporosis.   Mental health: if need for mental health care (medicines, counseling, other), or concerns about moods, please let me know!   Sexual health: if need for STD testing, or if concerns with libido/pain problems, please let me know!   Advanced Directive: Living Will and/or Healthcare Power of Attorney recommended for all adults, regardless of age or health.  Vaccines  Flu vaccine: recommended for almost everyone, every fall.   Shingles vaccine: recommended after age 41.  Pneumonia vaccines: recommended after age 65/earlier if smoker  Tetanus booster: Tdap recommended every 10 years. Due 2024  COVID vaccine recommended as soon as you are eligible: see ncdhhs/gov web site Cancer screenings   Colon cancer screening: Cologuard ordered  Breast cancer screening: mammogram annually, due 11/2020  Cervical cancer screening: Pap every 1 to 5 years depending on age and other risk factors. Can usually stop at age 60 or w/ hysterectomy.   Lung cancer screening: CT chest every year for those age 22 to 57 years old with ?30 pack year smoking history, who either currently smoke or have quit within the past 15 years. Infection screenings . HIV: screening done 2017  . Gonorrhea/Chlamydia: screening as needed . Hepatitis C: recommended for anyone born 22-1963. Done 2017 . TB: certain at-risk populations, or depending on work requirements and/or travel history Other . Bone Density Test: recommended for women at age 46, sooner for smokers

## 2019-12-21 NOTE — Progress Notes (Signed)
HPI: Roberta Livingston is a 59 y.o. female who  has a past medical history of Hyperlipidemia LDL goal < 160 (07/08/2013).  she presents to [facility] today, 12/21/19,  for chief complaint of: Annual physical     Patient here for annual physical / wellness exam.  See preventive care reviewed as below.   Additional concerns today include:  GERD not helped by OTC omeprazole prn. No abdominal pain.   History obtained w/ assistance of video interpreter     Past medical, surgical, social and family history reviewed:  Patient Active Problem List   Diagnosis Date Noted  . Bloody stools 09/10/2017  . Nipple discharge 09/10/2017  . Early satiety 09/10/2017  . Annual physical exam 07/24/2016  . Skin spots, red 07/24/2016  . Breast lump in female 07/24/2016  . Skin tag of vulva 09/29/2013  . Tobacco use disorder 09/29/2013  . Hyperlipidemia with target low density lipoprotein (LDL) cholesterol less than 160 mg/dL 07/08/2013  . Irregular periods 07/07/2013  . RIB PAIN, RIGHT SIDED 10/30/2010    Past Surgical History:  Procedure Laterality Date  . BREAST CYST ASPIRATION      Social History   Tobacco Use  . Smoking status: Current Every Day Smoker    Packs/day: 0.50    Years: 5.00    Pack years: 2.50    Types: Cigarettes  . Smokeless tobacco: Never Used  Substance Use Topics  . Alcohol use: No    Family History  Problem Relation Age of Onset  . Hypertension Mother   . Heart disease Mother   . Cancer Father   . Heart disease Sister   . Cancer Brother      Current medication list and allergy/intolerance information reviewed:    No current outpatient medications on file.   No current facility-administered medications for this visit.    No Known Allergies    Review of Systems: negative   Exam:  LMP 05/29/2013   Constitutional: VS see above. General Appearance: alert, well-developed, well-nourished, NAD  Eyes: Normal lids and  conjunctive, non-icteric sclera  Neck: No masses, trachea midline. No thyroid enlargement. No tenderness/mass appreciated. No lymphadenopathy  Respiratory: Normal respiratory effort. no wheeze, no rhonchi, no rales  Cardiovascular: S1/S2 normal, no murmur, no rub/gallop auscultated. RRR. No lower extremity edema. .  Gastrointestinal: Nontender, no masses. No hepatomegaly, no splenomegaly. No hernia appreciated. Bowel sounds normal. Rectal exam deferred.   Musculoskeletal: Gait normal. No clubbing/cyanosis of digits.   Neurological: Normal balance/coordination. No tremor. No cranial nerve deficit on limited exam. Motor and sensation intact and symmetric. Cerebellar reflexes intact.   Skin: warm, dry, intact. No rash/ulcer. No concerning nevi or subq nodules on limited exam.   Psychiatric: Normal judgment/insight. Normal mood and affect. Oriented x3.  GYN: No lesions/ulcers to external genitalia, normal urethra, normal vaginal mucosa, physiologic discharge, cervix normal without lesions, uterus not enlarged or tender, adnexa no masses and nontender    No results found for this or any previous visit (from the past 39 hour(s)).  No results found.       ASSESSMENT/PLAN: The primary encounter diagnosis was Annual physical exam. Diagnoses of Colon cancer screening, Cervical cancer screening, Influenza vaccination declined, Hyperlipidemia with target low density lipoprotein (LDL) cholesterol less than 160 mg/dL, Gastroesophageal reflux disease without esophagitis, and Need for shingles vaccine were also pertinent to this visit.   Orders Placed This Encounter  Procedures  . Cologuard  . CBC  . COMPLETE METABOLIC PANEL WITH  GFR  . Lipid panel    Meds ordered this encounter  Medications  . pantoprazole (PROTONIX) 40 MG tablet    Sig: Take 1 tablet (40 mg total) by mouth daily.    Dispense:  90 tablet    Refill:  0  . famotidine (PEPCID) 20 MG tablet    Sig: Take 1 tablet (20 mg  total) by mouth daily.    Dispense:  90 tablet    Refill:  3    Patient Instructions  General Preventive Care  Most recent routine blood work: ordered today  Everyone should have blood pressure checked once per year.   Tobacco: don't! Please let me know if you need help quitting!  Alcohol: responsible moderation is ok for most adults - if you have concerns about your alcohol intake, please talk to me!   Exercise: as tolerated to reduce risk of cardiovascular disease and diabetes. Strength training will also prevent osteoporosis.   Mental health: if need for mental health care (medicines, counseling, other), or concerns about moods, please let me know!   Sexual health: if need for STD testing, or if concerns with libido/pain problems, please let me know!   Advanced Directive: Living Will and/or Healthcare Power of Attorney recommended for all adults, regardless of age or health.  Vaccines  Flu vaccine: recommended for almost everyone, every fall.   Shingles vaccine: recommended after age 48.  Pneumonia vaccines: recommended after age 9/earlier if smoker  Tetanus booster: Tdap recommended every 10 years. Due 2024  COVID vaccine recommended as soon as you are eligible: see ncdhhs/gov web site Cancer screenings   Colon cancer screening: Cologuard ordered  Breast cancer screening: mammogram annually, due 11/2020  Cervical cancer screening: Pap every 1 to 5 years depending on age and other risk factors. Can usually stop at age 5 or w/ hysterectomy.   Lung cancer screening: CT chest every year for those sge 54 to 57 years old with ?30 pack year smoking history, who either currently smoke or have quit within the past 15 years. Infection screenings . HIV, Gonorrhea/Chlamydia: screening as needed . Hepatitis C: recommended for anyone born 26-1963. Done 2017 . TB: certain at-risk populations, or depending on work requirements and/or travel history Other . Bone Density Test:  recommended for women at age 25, sooner for smokers             Visit summary with medication list and pertinent instructions was printed for patient to review. All questions at time of visit were answered - patient instructed to contact office with any additional concerns or updates. ER/RTC precautions were reviewed with the patient.   Please note: voice recognition software was used to produce this document, and typos may escape review. Please contact Dr. Lyn Hollingshead for any needed clarifications.     Follow-up plan: Return in about 1 year (around 12/20/2020) for [place] (call week prior to visit for lab orders).

## 2019-12-22 LAB — CYTOLOGY - PAP
Comment: NEGATIVE
Diagnosis: NEGATIVE
High risk HPV: NEGATIVE

## 2020-01-04 DIAGNOSIS — Z1211 Encounter for screening for malignant neoplasm of colon: Secondary | ICD-10-CM | POA: Diagnosis not present

## 2020-01-04 DIAGNOSIS — Z1212 Encounter for screening for malignant neoplasm of rectum: Secondary | ICD-10-CM | POA: Diagnosis not present

## 2020-01-04 LAB — COLOGUARD: Cologuard: NEGATIVE

## 2020-01-07 LAB — COLOGUARD: COLOGUARD: NEGATIVE

## 2020-02-22 ENCOUNTER — Other Ambulatory Visit: Payer: Self-pay

## 2020-02-22 ENCOUNTER — Ambulatory Visit (INDEPENDENT_AMBULATORY_CARE_PROVIDER_SITE_OTHER): Payer: BC Managed Care – PPO | Admitting: Medical-Surgical

## 2020-02-22 VITALS — BP 137/70 | HR 77 | Ht 63.0 in | Wt 164.0 lb

## 2020-02-22 DIAGNOSIS — Z23 Encounter for immunization: Secondary | ICD-10-CM

## 2020-02-22 NOTE — Progress Notes (Signed)
Patient is here for a second shingles vaccination. Denies any problems with first injection, just mild soreness in her arm. Shingles injection to left deltoid (per patient request) with no apparent complications. Patient advised to call with any problems.

## 2020-06-17 ENCOUNTER — Ambulatory Visit (INDEPENDENT_AMBULATORY_CARE_PROVIDER_SITE_OTHER): Payer: BC Managed Care – PPO | Admitting: Sports Medicine

## 2020-06-17 ENCOUNTER — Other Ambulatory Visit: Payer: Self-pay

## 2020-06-17 ENCOUNTER — Encounter: Payer: Self-pay | Admitting: Sports Medicine

## 2020-06-17 ENCOUNTER — Ambulatory Visit (INDEPENDENT_AMBULATORY_CARE_PROVIDER_SITE_OTHER): Payer: BC Managed Care – PPO

## 2020-06-17 DIAGNOSIS — L301 Dyshidrosis [pompholyx]: Secondary | ICD-10-CM

## 2020-06-17 DIAGNOSIS — M549 Dorsalgia, unspecified: Secondary | ICD-10-CM

## 2020-06-17 DIAGNOSIS — G8929 Other chronic pain: Secondary | ICD-10-CM | POA: Diagnosis not present

## 2020-06-17 DIAGNOSIS — M542 Cervicalgia: Secondary | ICD-10-CM | POA: Diagnosis not present

## 2020-06-17 DIAGNOSIS — M545 Low back pain: Secondary | ICD-10-CM | POA: Diagnosis not present

## 2020-06-17 DIAGNOSIS — M546 Pain in thoracic spine: Secondary | ICD-10-CM | POA: Diagnosis not present

## 2020-06-17 MED ORDER — CLOBETASOL PROPIONATE 0.05 % EX OINT: 1.0000 "application " | TOPICAL_OINTMENT | Freq: Two times a day (BID) | CUTANEOUS | 0 refills | Status: DC

## 2020-06-17 NOTE — Assessment & Plan Note (Addendum)
For 1 year this is a 59 year old female has had a highly pruritic rash with papules and pustules on her feet, bilateral heels, she has been using Lotrisone for several weeks without any improvement. I am going to switch to clobetasol high potency twice daily, ointment. Return to see [place] in a month to see how things are going. If no better I will biopsy the rash.

## 2020-06-17 NOTE — Progress Notes (Signed)
    Procedures performed today:    None.  Independent interpretation of notes and tests performed by another provider:   None.  Brief History, Exam, Impression, and Recommendations:    Dyshidrotic eczema For 1 year this is a 59 year old female has had a highly pruritic rash with papules and pustules on her feet, bilateral heels, she has been using Lotrisone for several weeks without any improvement. I am going to switch to clobetasol high potency twice daily, ointment. Return to see [place] in a month to see how things are going. If no better I will biopsy the rash.  Chronic neck and back pain Sona also has pain at the base of her skull, running down her neck, paraspinals down her thoracic and lumbar spine. X-rays, formal physical therapy, return in a month for this.    ___________________________________________ Ihor Austin. Benjamin Stain, M.D., ABFM., CAQSM. Primary Care and Sports Medicine [facility]  Adjunct Instructor of Family Medicine  [employer]

## 2020-06-17 NOTE — Assessment & Plan Note (Signed)
Roberta Livingston also has pain at the base of her skull, running down her neck, paraspinals down her thoracic and lumbar spine. X-rays, formal physical therapy, return in a month for this.

## 2020-06-29 ENCOUNTER — Other Ambulatory Visit: Payer: Self-pay | Admitting: Sports Medicine

## 2020-06-29 DIAGNOSIS — L301 Dyshidrosis [pompholyx]: Secondary | ICD-10-CM

## 2020-06-29 NOTE — Telephone Encounter (Signed)
To PCP

## 2020-08-16 ENCOUNTER — Emergency Department: Admit: 2020-08-16 | Discharge status: Absent without leave | Payer: Self-pay

## 2020-08-16 ENCOUNTER — Other Ambulatory Visit: Payer: Self-pay

## 2020-08-16 ENCOUNTER — Emergency Department
Admission: EM | Admit: 2020-08-16 | Discharge: 2020-08-16 | Disposition: A | Discharge status: Home / Self Care | Payer: BC Managed Care – PPO | Source: Home / Self Care | Attending: Family Medicine | Admitting: Family Medicine

## 2020-08-16 DIAGNOSIS — R21 Rash and other nonspecific skin eruption: Secondary | ICD-10-CM

## 2020-08-16 DIAGNOSIS — B353 Tinea pedis: Secondary | ICD-10-CM

## 2020-08-16 MED ORDER — DOXYCYCLINE HYCLATE 100 MG PO CAPS: ORAL_CAPSULE | ORAL | 0 refills | Status: DC

## 2020-08-16 MED ORDER — KETOCONAZOLE 2 % EX CREA: TOPICAL_CREAM | CUTANEOUS | 1 refills | Status: DC

## 2020-08-16 NOTE — ED Provider Notes (Signed)
[facility]    CSN: [id number] Arrival date & time: 08/16/20  1600      History   Chief Complaint Chief Complaint  Patient presents with  . Rash    HPI Roberta Livingston is a 59 y.o. female.   Patient complains of persistent pruritic rash on feet for about a year.  The rash did not respond to clobetasol cream.  She also complains of a persistent rash on her face.  She denies pain, swelling, or fever.  The history is provided by the patient.    Past Medical History:  Diagnosis Date  . Hyperlipidemia LDL goal < 160 07/08/2013    Patient Active Problem List   Diagnosis Date Noted  . Dyshidrotic eczema 06/17/2020  . Chronic neck and back pain 06/17/2020  . Gastroesophageal reflux disease without esophagitis 12/21/2019  . Influenza vaccination declined 12/21/2019  . Bloody stools 09/10/2017  . Nipple discharge 09/10/2017  . Early satiety 09/10/2017  . Annual physical exam 07/24/2016  . Skin spots, red 07/24/2016  . Breast lump in female 07/24/2016  . Skin tag of vulva 09/29/2013  . Tobacco use disorder 09/29/2013  . Hyperlipidemia with target low density lipoprotein (LDL) cholesterol less than 160 mg/dL 62/13/0865  . Irregular periods 07/07/2013  . RIB PAIN, RIGHT SIDED 10/30/2010    Past Surgical History:  Procedure Laterality Date  . BREAST CYST ASPIRATION      OB History   No obstetric history on file.      Home Medications    Prior to Admission medications   Medication Sig Start Date End Date Taking? Authorizing Provider  clobetasol ointment (TEMOVATE) 0.05 % APPLY TOPICALLY TO THE AFFECTED AREA TWICE DAILY 07/01/20   Sunnie Nielsen, DO  doxycycline (VIBRAMYCIN) 100 MG capsule Take one cap PO Q12hr with food. 08/16/20   Lattie Haw, MD  famotidine (PEPCID) 20 MG tablet Take 1 tablet (20 mg total) by mouth daily. 12/21/19 12/15/20  Sunnie Nielsen, DO  ketoconazole (NIZORAL) 2 % cream Apply to rash on feet once daily for 6 weeks 08/16/20    Lattie Haw, MD  pantoprazole (PROTONIX) 40 MG tablet Take 1 tablet (40 mg total) by mouth daily. 12/21/19 03/20/20  Sunnie Nielsen, DO    Family History Family History  Problem Relation Age of Onset  . Hypertension Mother   . Heart disease Mother   . Cancer Father   . Heart disease Sister   . Cancer Brother     Social History Social History   Tobacco Use  . Smoking status: Current Every Day Smoker    Packs/day: 0.50    Years: 5.00    Pack years: 2.50    Types: Cigarettes  . Smokeless tobacco: Never Used  Vaping Use  . Vaping Use: Never used  Substance Use Topics  . Alcohol use: No  . Drug use: No     Allergies   Patient has no known allergies.   Review of Systems Review of Systems  Constitutional: Negative for activity change, chills, diaphoresis, fatigue and fever.  HENT: Negative.   Eyes: Negative.   Respiratory: Negative.   Cardiovascular: Negative.   Gastrointestinal: Negative.   Genitourinary: Negative.   Musculoskeletal: Negative.   Skin: Positive for rash.  Neurological: Negative.   Hematological: Negative for adenopathy.     Physical Exam Triage Vital Signs ED Triage Vitals  Enc Vitals Group     BP 08/16/20 1646 117/72     Pulse Rate 08/16/20 1644 68  Resp 08/16/20 1644 17     Temp 08/16/20 1644 98.5 F (36.9 C)     Temp Source 08/16/20 1644 Oral     SpO2 08/16/20 1644 98 %     Weight --      Height --      Head Circumference --      Peak Flow --      Pain Score 08/16/20 1646 0     Pain Loc --      Pain Edu? --      Excl. in GC? --    No data found.  Updated Vital Signs BP 117/72   Pulse 68   Temp 98.5 F (36.9 C) (Oral)   Resp 17   LMP 05/29/2013   SpO2 98%   Visual Acuity Right Eye Distance:   Left Eye Distance:   Bilateral Distance:    Right Eye Near:   Left Eye Near:    Bilateral Near:     Physical Exam Vitals and nursing note reviewed.  Constitutional:      General: She is not in acute  distress. HENT:     Head: Normocephalic.      Comments: Face has scattered small erythematous macules without tenderness to palpation.    Right Ear: External ear normal.     Left Ear: External ear normal.     Nose: Nose normal.     Mouth/Throat:     Pharynx: Oropharynx is clear.  Eyes:     Pupils: Pupils are equal, round, and reactive to light.  Cardiovascular:     Rate and Rhythm: Normal rate.  Pulmonary:     Effort: Pulmonary effort is normal.  Musculoskeletal:     Cervical back: Neck supple.     Right lower leg: No edema.     Left lower leg: No edema.       Feet:     Comments: There is erythema in web spaces without tenderness to palpation.  Plantar surfaces have hyperkeratosis.  Lymphadenopathy:     Cervical: No cervical adenopathy.  Skin:    General: Skin is warm and dry.  Neurological:     Mental Status: She is alert.      UC Treatments / Results  Labs (all labs ordered are listed, but only abnormal results are displayed) Labs Reviewed - No data to display  EKG   Radiology No results found.  Procedures Procedures (including critical care time)  Medications Ordered in UC Medications - No data to display  Initial Impression / Assessment and Plan / UC Course  I have reviewed the triage vital signs and the nursing notes.  Pertinent labs & imaging results that were available during my care of the patient were reviewed by me and considered in my medical decision making (see chart for details).    Begin Nizoral cream once daily to feet. Facial rash possibly perioral dermatitis.  Begin doxycycline 100mg  BID for one week. Followup with dermatologist if facial rash not improved 2 weeks.   Final Clinical Impressions(s) / UC Diagnoses   Final diagnoses:  Tinea pedis of both feet  Facial rash     Discharge Instructions     Stop clobetasol ointment.    ED Prescriptions    Medication Sig Dispense Auth. Provider   ketoconazole (NIZORAL) 2 % cream  Apply to rash on feet once daily for 6 weeks 60 g , MD   doxycycline (VIBRAMYCIN) 100 MG capsule Take one cap PO Q12hr with food. 14  capsule Lattie Haw, MD        Lattie Haw, MD 08/19/20 [phone]

## 2020-08-16 NOTE — Discharge Instructions (Addendum)
-   Stop clobetasol ointment

## 2020-08-16 NOTE — ED Triage Notes (Signed)
Pt c/o rash on neck and feet x 1 year. Says its itchy and hot to touch. Was rx'd clobetasol cream from PCP which did not help. Has used 3 tubes of it with no relief.

## 2020-08-29 DIAGNOSIS — D2239 Melanocytic nevi of other parts of face: Secondary | ICD-10-CM | POA: Diagnosis not present

## 2020-08-29 DIAGNOSIS — B351 Tinea unguium: Secondary | ICD-10-CM | POA: Diagnosis not present

## 2020-08-29 DIAGNOSIS — L300 Nummular dermatitis: Secondary | ICD-10-CM | POA: Diagnosis not present

## 2020-11-28 DIAGNOSIS — B351 Tinea unguium: Secondary | ICD-10-CM | POA: Diagnosis not present

## 2020-11-28 DIAGNOSIS — L300 Nummular dermatitis: Secondary | ICD-10-CM | POA: Diagnosis not present

## 2020-11-28 DIAGNOSIS — B353 Tinea pedis: Secondary | ICD-10-CM | POA: Diagnosis not present

## 2020-12-06 ENCOUNTER — Other Ambulatory Visit: Payer: BC Managed Care – PPO

## 2020-12-06 DIAGNOSIS — Z20822 Contact with and (suspected) exposure to covid-19: Secondary | ICD-10-CM | POA: Diagnosis not present

## 2020-12-06 DIAGNOSIS — U071 COVID-19: Secondary | ICD-10-CM | POA: Diagnosis not present

## 2020-12-06 DIAGNOSIS — R059 Cough, unspecified: Secondary | ICD-10-CM | POA: Diagnosis not present

## 2020-12-09 DIAGNOSIS — Z9189 Other specified personal risk factors, not elsewhere classified: Secondary | ICD-10-CM | POA: Diagnosis not present

## 2020-12-09 DIAGNOSIS — U071 COVID-19: Secondary | ICD-10-CM | POA: Diagnosis not present

## 2022-06-11 ENCOUNTER — Other Ambulatory Visit: Payer: Self-pay | Admitting: Family Medicine

## 2022-06-11 ENCOUNTER — Other Ambulatory Visit: Payer: Self-pay | Admitting: Osteopathic Medicine

## 2022-06-11 DIAGNOSIS — Z1231 Encounter for screening mammogram for malignant neoplasm of breast: Secondary | ICD-10-CM

## 2022-06-14 ENCOUNTER — Ambulatory Visit (INDEPENDENT_AMBULATORY_CARE_PROVIDER_SITE_OTHER): Payer: BC Managed Care – PPO

## 2022-06-14 DIAGNOSIS — Z1231 Encounter for screening mammogram for malignant neoplasm of breast: Secondary | ICD-10-CM

## 2022-06-18 ENCOUNTER — Encounter: Payer: Self-pay | Admitting: Family Medicine

## 2022-06-18 NOTE — Progress Notes (Signed)
Informed pt of normal mammogram via leter

## 2022-06-22 IMAGING — DX DG CERVICAL SPINE COMPLETE 4+V
5 series · 6 of 6 positions shown · non-contrast
Comparison: None.

CLINICAL DATA: 58-year-old female with neck pain for 1 year.
Initial encounter.

EXAM:
CERVICAL SPINE - COMPLETE 4+ VIEW

[c-spine lat]
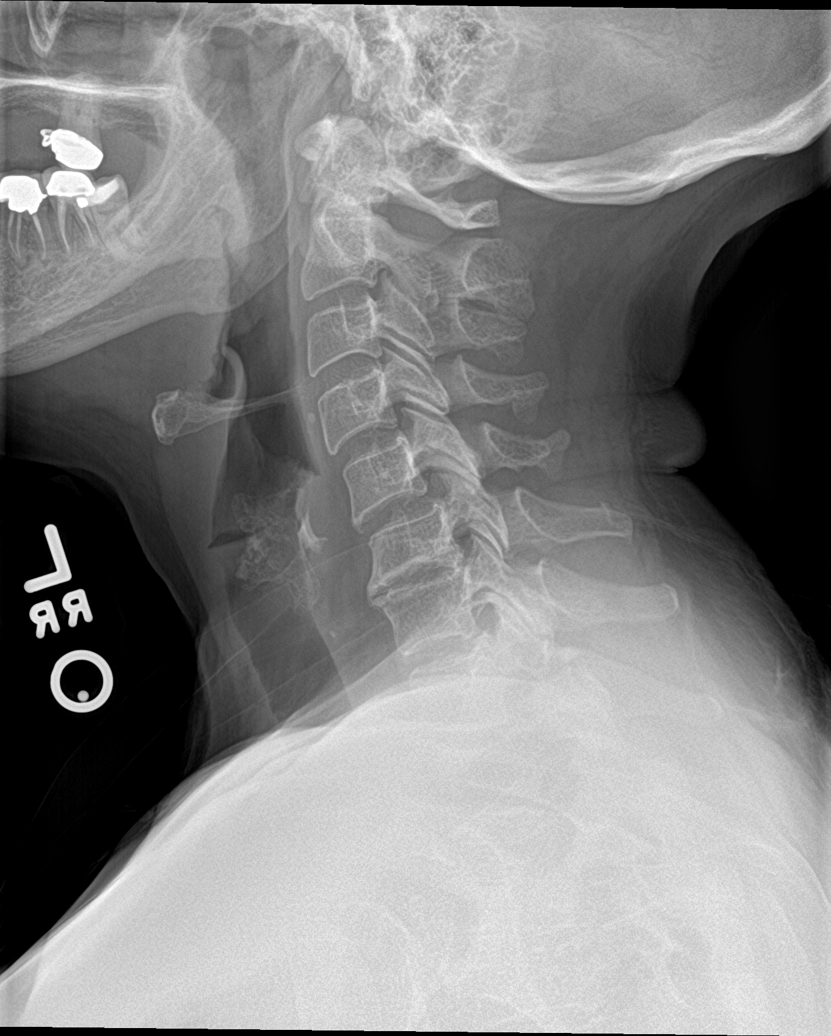

[c-spine obl (1 of 2)]
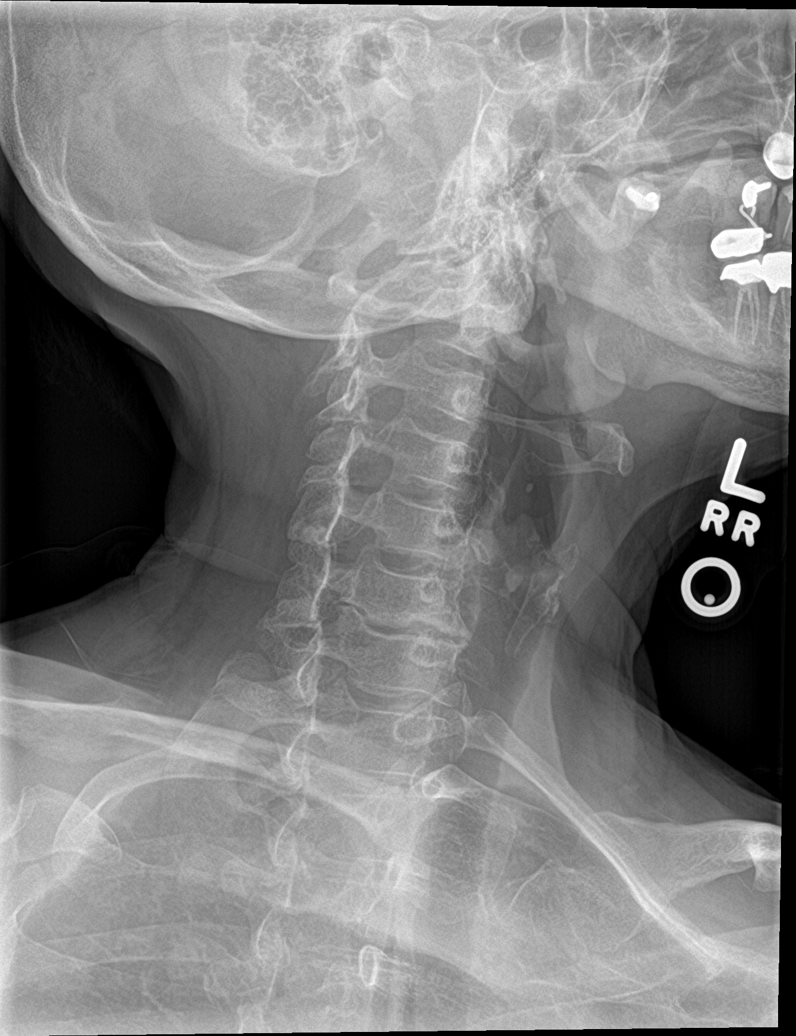

[c-spine obl (2 of 2)]
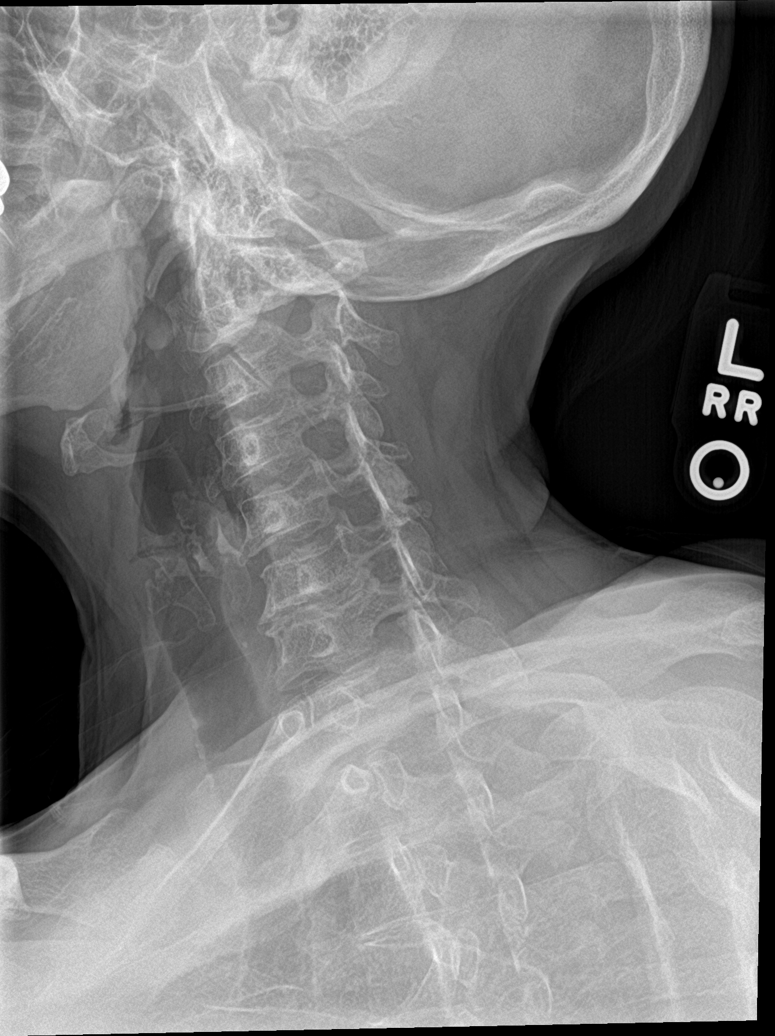

[c-spine ap]
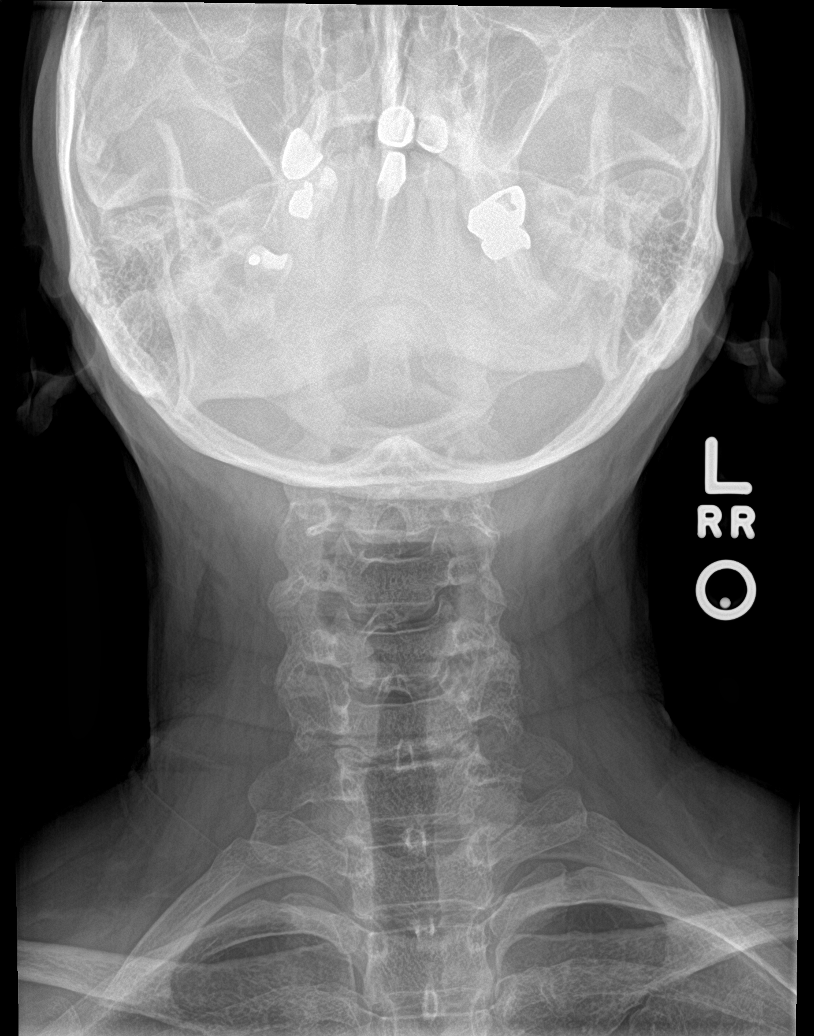

[Series 5: c-spine open mouth · 0.14mm/px · 2 of 2 slices shown]
[im 1/2]
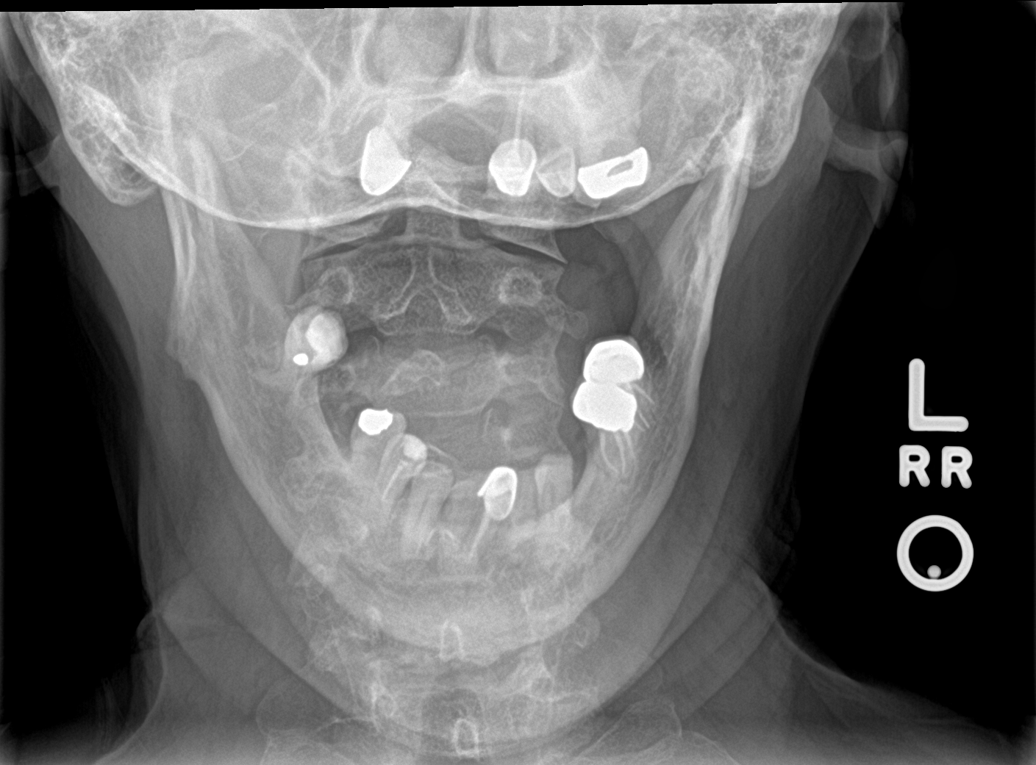
[im 2/2]
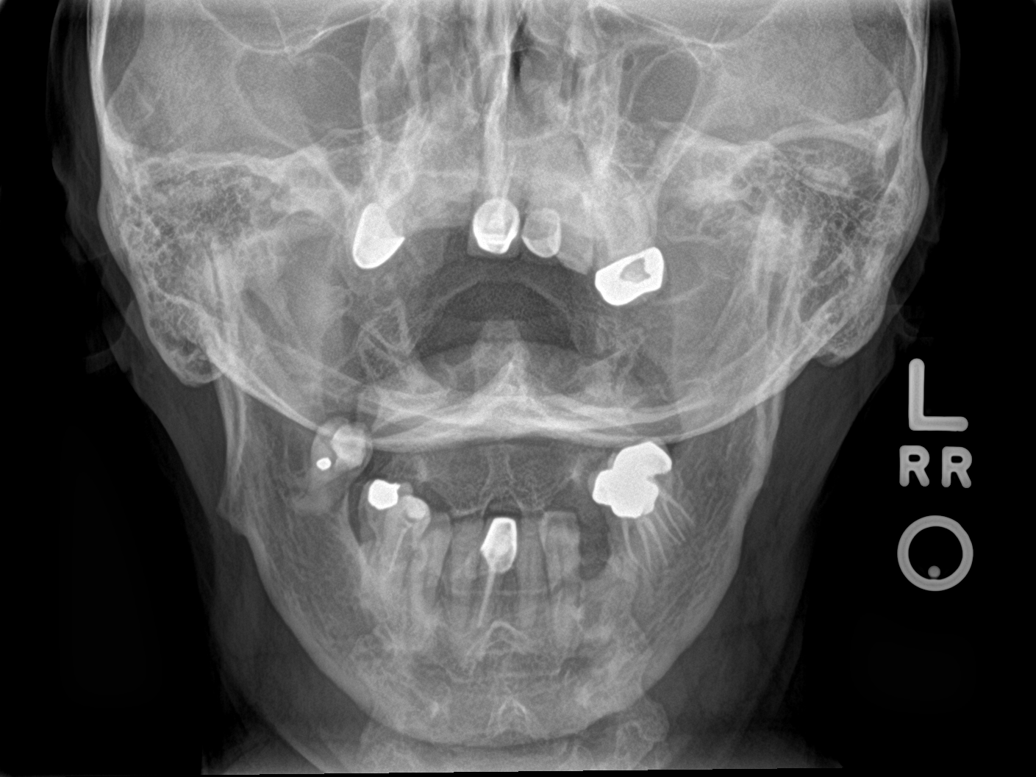

[6 of 6 positions shown; findings below may reference images not displayed]

FINDINGS: There is no evidence of fracture or subluxation.

No prevertebral soft tissue swelling is present.

Moderate degenerative disc disease at C6-7 noted with mild RIGHT
bony foraminal narrowing at this level.

No focal bony lesions are present.

Mild multilevel facet arthropathy noted.
IMPRESSION: 1. Moderate degenerative disc disease at C6-7 with mild RIGHT bony
foraminal narrowing at this level. Mild multilevel facet
arthropathy.
2. No acute abnormality.

## 2022-06-22 IMAGING — DX DG LUMBAR SPINE COMPLETE 4+V
5 series · 5 of 5 positions shown · non-contrast
Comparison: 07/21/2018

CLINICAL DATA: 58-year-old female with low back pain for 1 year.

EXAM:
LUMBAR SPINE - COMPLETE 4+ VIEW

[l-spine ap]
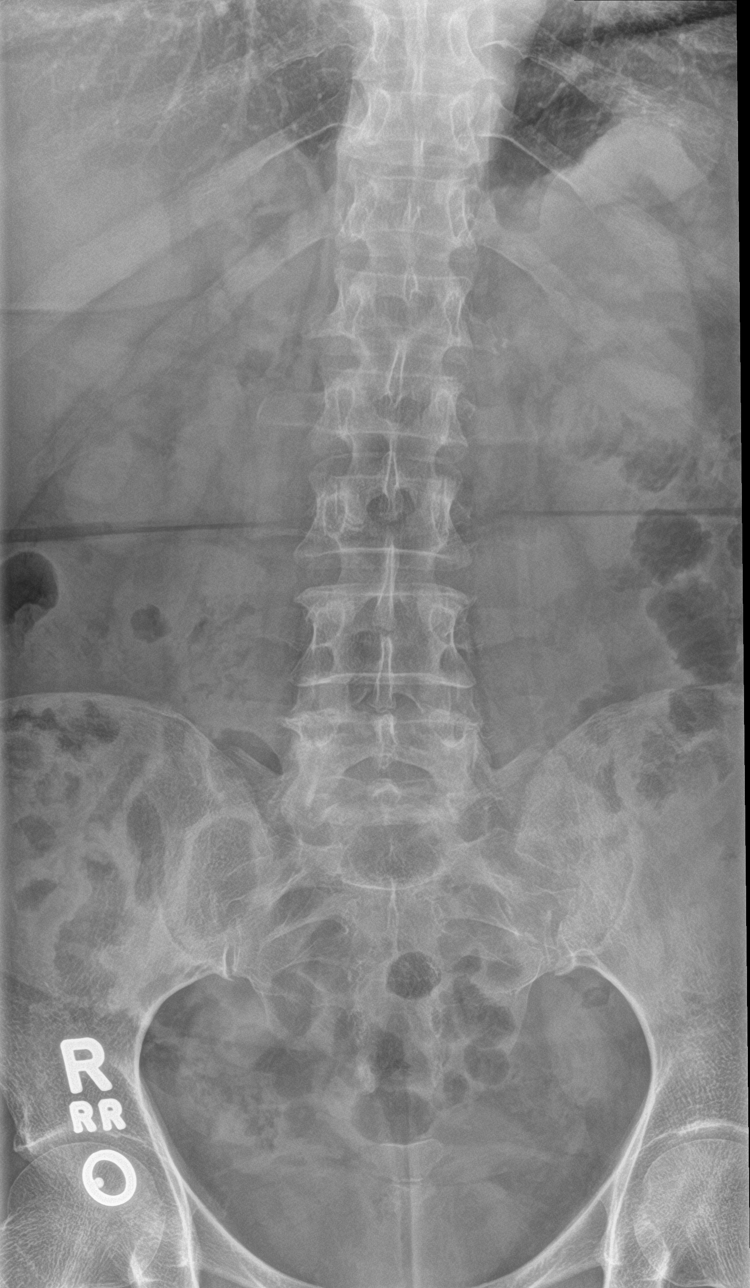

[l-spine obl (1 of 2)]
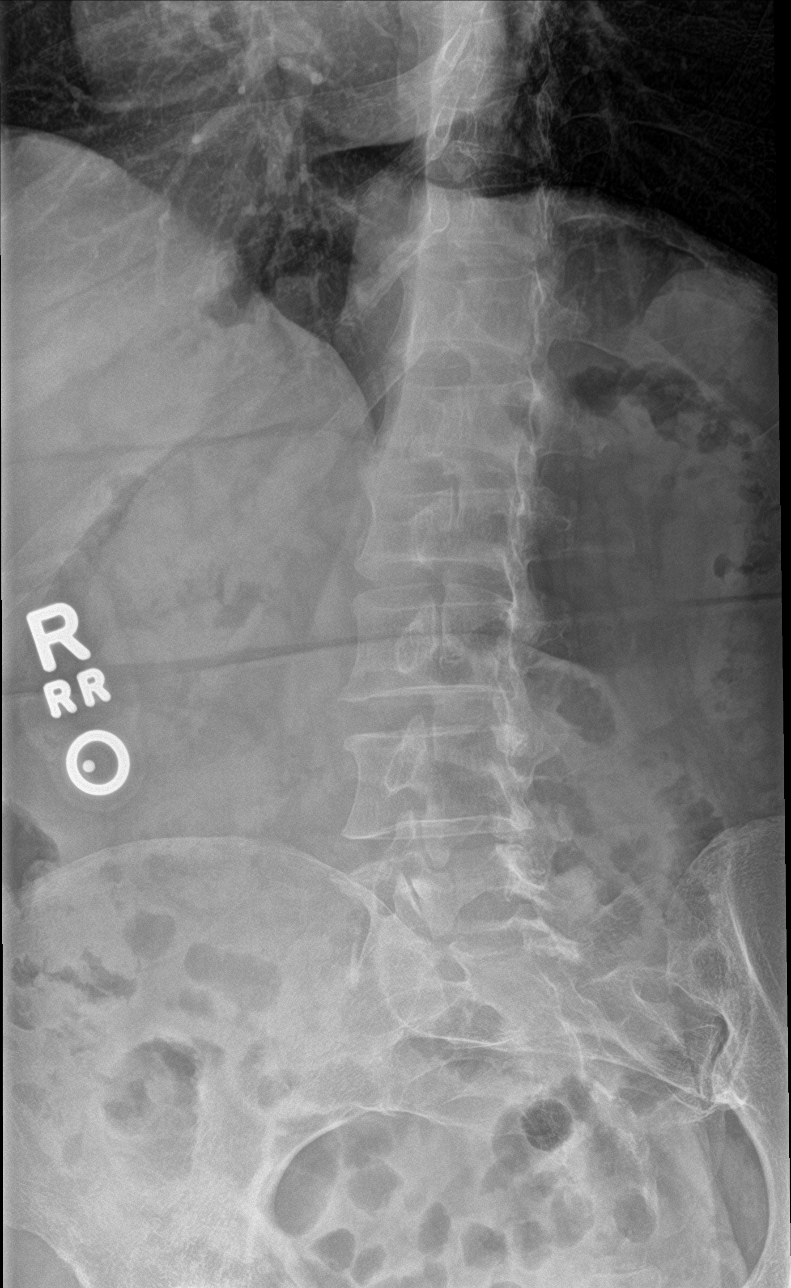

[l-spine obl (2 of 2)]
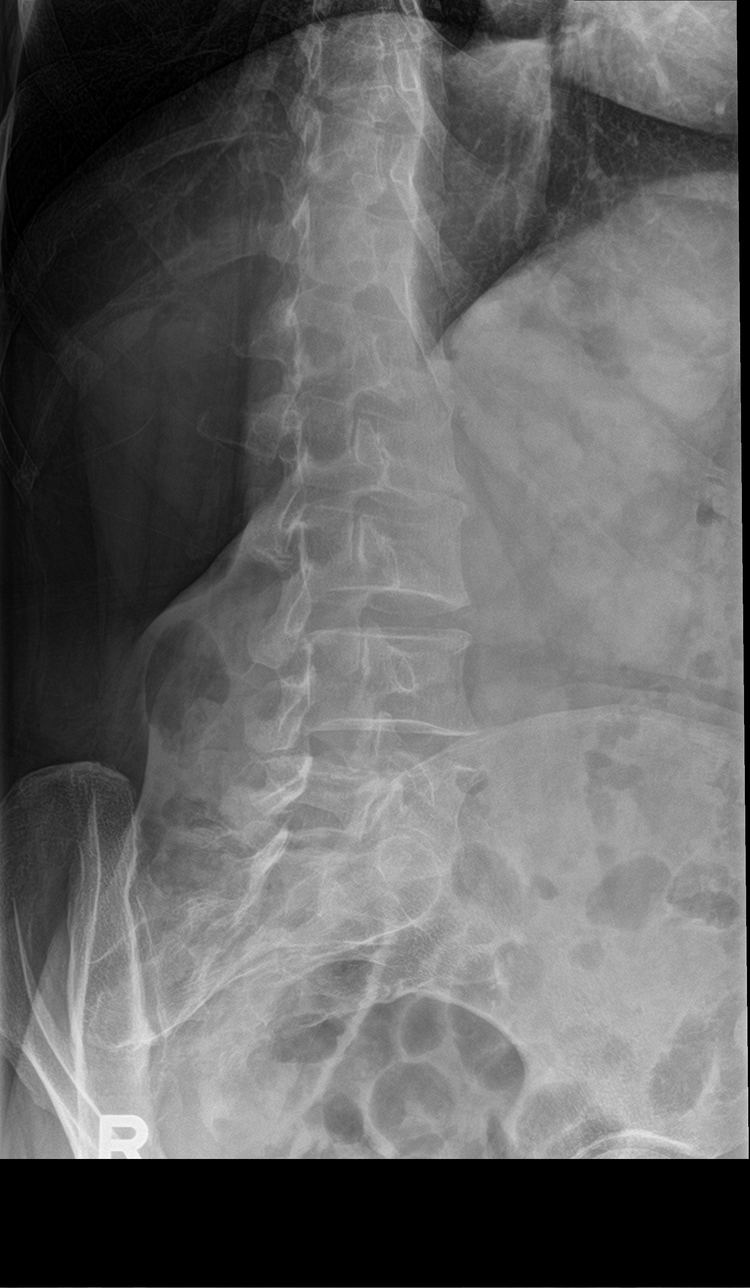

[l-spine lat]
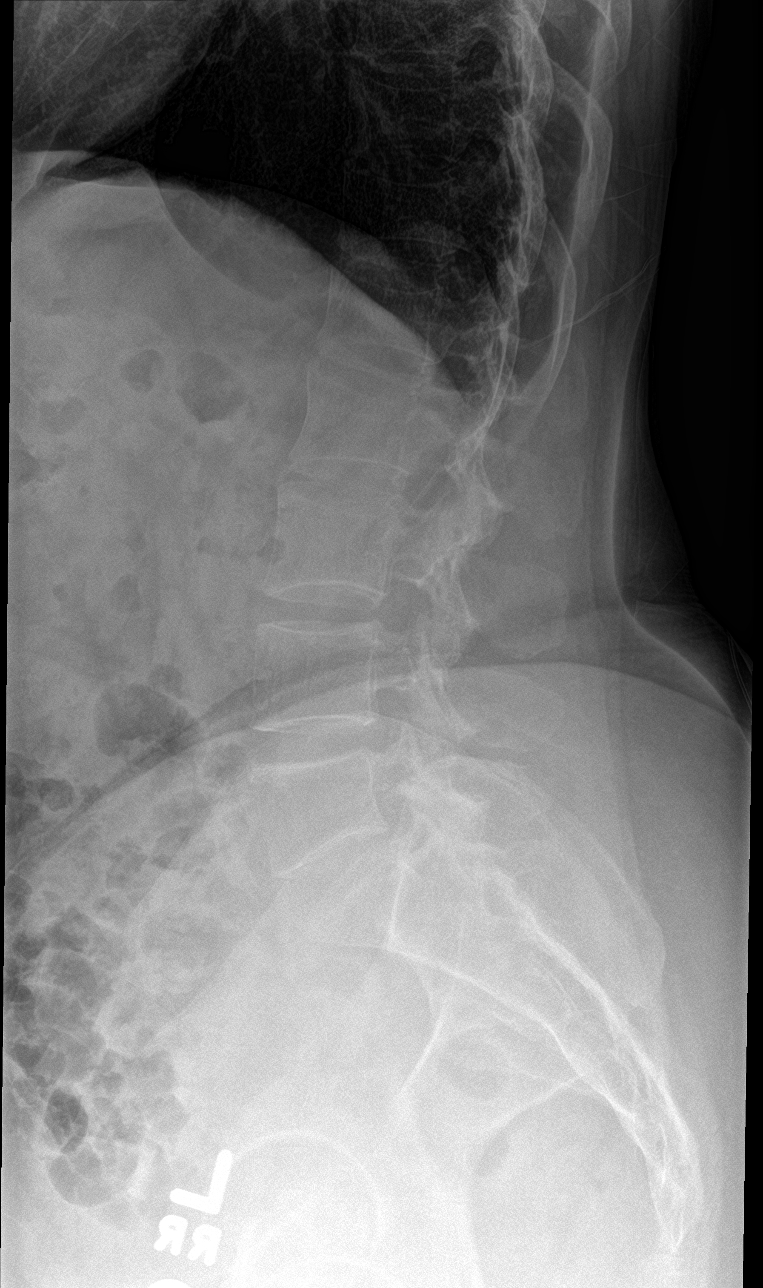

[l-spine spot]
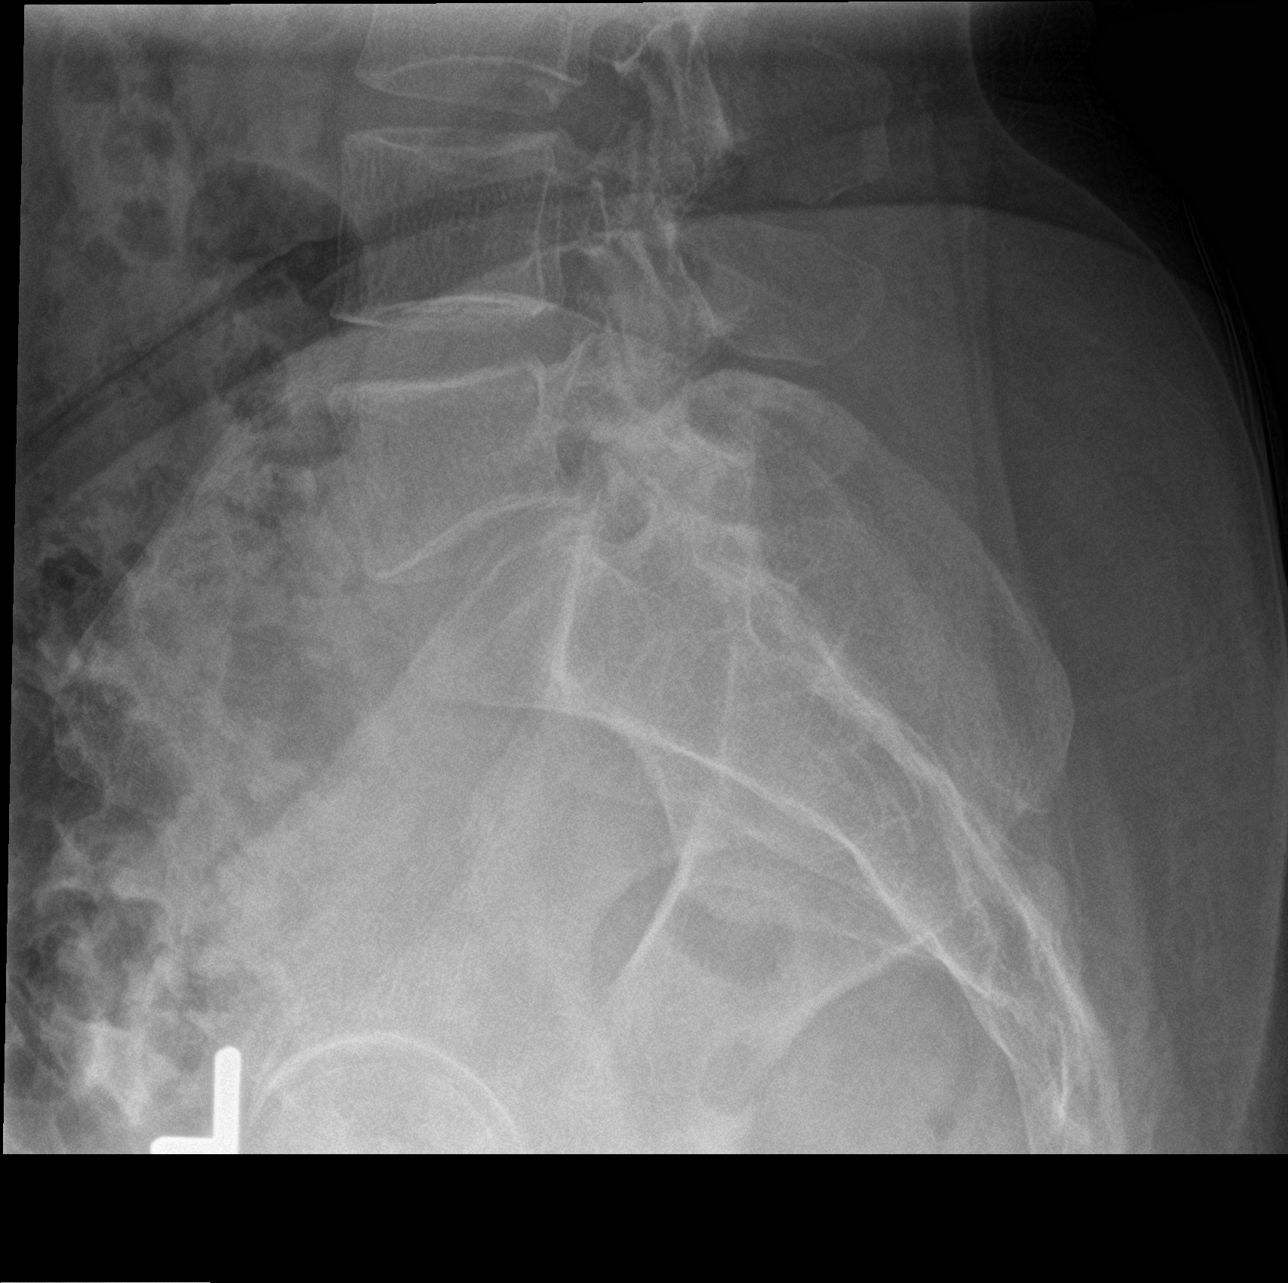

[5 of 5 positions shown; findings below may reference images not displayed]

FINDINGS: Five non rib-bearing lumbar type vertebra are again identified in
normal alignment.

There is no evidence of fracture or subluxation.

The disc spaces are maintained.

Mild facet arthropathy in the LOWER lumbar spine is present.
IMPRESSION: Mild facet arthropathy in the LOWER lumbar spine.

## 2022-07-03 ENCOUNTER — Ambulatory Visit (INDEPENDENT_AMBULATORY_CARE_PROVIDER_SITE_OTHER): Payer: BC Managed Care – PPO | Admitting: Family Medicine

## 2022-07-03 VITALS — BP 100/54 | HR 70 | Ht 63.0 in | Wt 155.0 lb

## 2022-07-03 DIAGNOSIS — Z1329 Encounter for screening for other suspected endocrine disorder: Secondary | ICD-10-CM | POA: Diagnosis not present

## 2022-07-03 DIAGNOSIS — Z13220 Encounter for screening for lipoid disorders: Secondary | ICD-10-CM

## 2022-07-03 DIAGNOSIS — Z0000 Encounter for general adult medical examination without abnormal findings: Secondary | ICD-10-CM

## 2022-07-03 DIAGNOSIS — Z131 Encounter for screening for diabetes mellitus: Secondary | ICD-10-CM

## 2022-07-03 LAB — CBC
HCT: 43.5 % (ref 35.0–45.0)
Hemoglobin: 14.6 g/dL (ref 11.7–15.5)
MCHC: 33.6 g/dL (ref 32.0–36.0)
MPV: 11.3 fL (ref 7.5–12.5)

## 2022-07-03 NOTE — Assessment & Plan Note (Signed)
-   pt doing well  - will order labs today for wellness exam  - UTD on all screenings  - return in one year for follow up

## 2022-07-03 NOTE — Progress Notes (Signed)
Annual Wellness Visit  Patient: Roberta Livingston, Female    DOB: 1961-03-31, 61 y.o.   MRN: [id number]  Subjective  Chief Complaint  Patient presents with   Establish Care    Roberta Livingston is a 61 y.o. female who presents today for her Annual Wellness Visit. She reports consuming a general diet.  She exercises in the home by moving around a lot.  She generally feels well. She reports sleeping well.  HPI  Vision:a long time ago. She says her vision is so so. Does admit to issues with farsightedness. and Dental: No current dental problems and Last dental visit: 2012. Denies tooth pain.   Mammogram: July 2023, normal Colonoscopy: Cologuard negative 2021, due in 2024 Pap smear: 2021, due in 2026  Medications: Outpatient Medications Prior to Visit  Medication Sig   famotidine (PEPCID) 20 MG tablet Take 1 tablet (20 mg total) by mouth daily.   pantoprazole (PROTONIX) 40 MG tablet Take 1 tablet (40 mg total) by mouth daily.   [DISCONTINUED] clobetasol ointment (TEMOVATE) 0.05 % APPLY TOPICALLY TO THE AFFECTED AREA TWICE DAILY (Patient not taking: Reported on 07/03/2022)   [DISCONTINUED] doxycycline (VIBRAMYCIN) 100 MG capsule Take one cap PO Q12hr with food. (Patient not taking: Reported on 07/03/2022)   [DISCONTINUED] ketoconazole (NIZORAL) 2 % cream Apply to rash on feet once daily for 6 weeks (Patient not taking: Reported on 07/03/2022)   No facility-administered medications prior to visit.    No Known Allergies  Patient Care Team: Charlton Amor, DO as PCP - General (Family Medicine)  Review of Systems  Constitutional:  Negative for chills and fever.  Respiratory:  Negative for cough and shortness of breath.   Cardiovascular:  Negative for chest pain.  Neurological:  Negative for headaches.        Objective  BP (!) 100/54   Pulse 70   Ht 5\' 3"  (1.6 m)   Wt 155 lb (70.3 kg)   LMP 05/29/2013   SpO2 97%   BMI 27.46 kg/m    Physical Exam Vitals and nursing note reviewed.   Constitutional:      General: She is not in acute distress.    Appearance: Normal appearance.  HENT:     Head: Normocephalic and atraumatic.     Right Ear: Tympanic membrane, ear canal and external ear normal.     Left Ear: Tympanic membrane, ear canal and external ear normal.     Nose: Nose normal.  Eyes:     Conjunctiva/sclera: Conjunctivae normal.  Cardiovascular:     Rate and Rhythm: Normal rate and regular rhythm.  Pulmonary:     Effort: Pulmonary effort is normal.     Breath sounds: Normal breath sounds.  Abdominal:     General: Abdomen is flat. Bowel sounds are normal.     Palpations: Abdomen is soft.  Musculoskeletal:     Comments: 5/5 upper and lower extremity bilaterally  Neurological:     General: No focal deficit present.     Mental Status: She is alert and oriented to person, place, and time.     Motor: No weakness.  Psychiatric:        Mood and Affect: Mood normal.        Behavior: Behavior normal.        Thought Content: Thought content normal.        Judgment: Judgment normal.       Most recent functional status assessment:     No data to display  Most recent fall risk assessment:    07/03/2022    9:29 AM  Fall Risk   Falls in the past year? 0  Number falls in past yr: 0  Injury with Fall? 0  Risk for fall due to : No Fall Risks    Most recent depression screenings:    07/03/2022    9:30 AM 12/21/2019    8:54 AM  PHQ 2/9 Scores  PHQ - 2 Score 0 0  PHQ- 9 Score  0    No results found for any visits on 07/03/22.    Assessment & Plan   Annual wellness visit done today including the all of the following: Reviewed patient's Family Medical History Reviewed and updated list of patient's medical providers Assessment of cognitive impairment was done Assessed patient's functional ability Established a written schedule for health screening services Health Risk Assessent Completed and Reviewed  Exercise Activities and Dietary  recommendations  Goals   None     Immunization History  Administered Date(s) Administered   Influenza Whole 10/30/2010   Influenza,inj,Quad PF,6+ Mos 09/29/2013   Janssen (J&J) SARS-COV-2 Vaccination 03/05/2020   Tdap 09/29/2013   Zoster Recombinat (Shingrix) 12/21/2019, 02/22/2020    Health Maintenance  Topic Date Due   INFLUENZA VACCINE  06/26/2022   COVID-19 Vaccine (2 - Janssen risk series) 07/04/2023 (Originally 04/02/2020)   Fecal DNA (Cologuard)  01/03/2023   TETANUS/TDAP  09/30/2023   MAMMOGRAM  06/14/2024   PAP SMEAR-Modifier  12/20/2024   Hepatitis C Screening  Completed   HIV Screening  Completed   Zoster Vaccines- Shingrix  Completed   HPV VACCINES  Aged Out     Discussed health benefits of physical activity, and encouraged her to engage in regular exercise appropriate for her age and condition.    Problem List Items Addressed This Visit       Other   Annual physical exam - Primary    - pt doing well  - will order labs today for wellness exam  - UTD on all screenings  - return in one year for follow up      Relevant Orders   Hemoglobin A1c   Lipid panel   TSH   COMPLETE METABOLIC PANEL WITH GFR   CBC   Other Visit Diagnoses     Screening for thyroid disorder       Relevant Orders   TSH   Screening for lipid disorders       Relevant Orders   Lipid panel   Screening for diabetes mellitus       Relevant Orders   Hemoglobin A1c       Return in about 1 year (around 07/04/2023) for annual exam.     Charlton Amor, DO

## 2022-07-04 LAB — CBC
MCH: 29 pg (ref 27.0–33.0)
MCV: 86.5 fL (ref 80.0–100.0)
Platelets: 318 10*3/uL (ref 140–400)
RBC: 5.03 10*6/uL (ref 3.80–5.10)
RDW: 13 % (ref 11.0–15.0)
WBC: 6 10*3/uL (ref 3.8–10.8)

## 2022-07-04 LAB — COMPLETE METABOLIC PANEL WITH GFR
AG Ratio: 1.5 (calc) (ref 1.0–2.5)
ALT: 14 U/L (ref 6–29)
AST: 14 U/L (ref 10–35)
Albumin: 3.9 g/dL (ref 3.6–5.1)
Alkaline phosphatase (APISO): 126 U/L (ref 37–153)
BUN: 12 mg/dL (ref 7–25)
CO2: 27 mmol/L (ref 20–32)
Calcium: 9.3 mg/dL (ref 8.6–10.4)
Chloride: 106 mmol/L (ref 98–110)
Creat: 0.66 mg/dL (ref 0.50–1.03)
Globulin: 2.6 g/dL (calc) (ref 1.9–3.7)
Glucose, Bld: 88 mg/dL (ref 65–99)
Potassium: 4.7 mmol/L (ref 3.5–5.3)
Sodium: 140 mmol/L (ref 135–146)
Total Bilirubin: 0.3 mg/dL (ref 0.2–1.2)
Total Protein: 6.5 g/dL (ref 6.1–8.1)
eGFR: 102 mL/min/{1.73_m2} (ref >=60)

## 2022-07-04 LAB — LIPID PANEL
Cholesterol: 248 mg/dL — ABNORMAL HIGH (ref <200)
HDL: 60 mg/dL (ref >=50)
LDL Cholesterol (Calc): 161 mg/dL (calc) — ABNORMAL HIGH
Non-HDL Cholesterol (Calc): 188 mg/dL (calc) — ABNORMAL HIGH (ref <130)
Total CHOL/HDL Ratio: 4.1 (calc) (ref <5.0)
Triglycerides: 145 mg/dL (ref <150)

## 2022-07-04 LAB — TSH: TSH: 1.33 mIU/L (ref 0.40–4.50)

## 2022-07-04 LAB — HEMOGLOBIN A1C
Hgb A1c MFr Bld: 5.5 % of total Hgb (ref <5.7)
Mean Plasma Glucose: 111 mg/dL
eAG (mmol/L): 6.2 mmol/L

## 2023-06-10 ENCOUNTER — Other Ambulatory Visit: Payer: Self-pay | Admitting: Family Medicine

## 2023-06-10 DIAGNOSIS — Z1231 Encounter for screening mammogram for malignant neoplasm of breast: Secondary | ICD-10-CM

## 2023-07-03 ENCOUNTER — Ambulatory Visit: Payer: BC Managed Care – PPO

## 2023-07-08 ENCOUNTER — Encounter: Payer: BC Managed Care – PPO | Admitting: Sports Medicine

## 2023-07-09 ENCOUNTER — Encounter: Payer: Self-pay | Admitting: Family Medicine

## 2023-07-09 ENCOUNTER — Ambulatory Visit (INDEPENDENT_AMBULATORY_CARE_PROVIDER_SITE_OTHER): Payer: BC Managed Care – PPO | Admitting: Family Medicine

## 2023-07-09 ENCOUNTER — Ambulatory Visit (HOSPITAL_BASED_OUTPATIENT_CLINIC_OR_DEPARTMENT_OTHER)
Admission: RE | Admit: 2023-07-09 | Discharge: 2023-07-09 | Disposition: A | Discharge status: Home / Self Care | Payer: BC Managed Care – PPO | Source: Ambulatory Visit | Attending: Family Medicine | Admitting: Family Medicine

## 2023-07-09 ENCOUNTER — Encounter (HOSPITAL_BASED_OUTPATIENT_CLINIC_OR_DEPARTMENT_OTHER): Payer: Self-pay

## 2023-07-09 VITALS — BP 121/71 | HR 62 | Temp 98.0°F | Resp 18 | Ht 64.0 in | Wt 151.4 lb

## 2023-07-09 DIAGNOSIS — Z1211 Encounter for screening for malignant neoplasm of colon: Secondary | ICD-10-CM

## 2023-07-09 DIAGNOSIS — Z13228 Encounter for screening for other metabolic disorders: Secondary | ICD-10-CM | POA: Diagnosis not present

## 2023-07-09 DIAGNOSIS — Z1329 Encounter for screening for other suspected endocrine disorder: Secondary | ICD-10-CM

## 2023-07-09 DIAGNOSIS — Z1283 Encounter for screening for malignant neoplasm of skin: Secondary | ICD-10-CM

## 2023-07-09 DIAGNOSIS — Z1231 Encounter for screening mammogram for malignant neoplasm of breast: Secondary | ICD-10-CM | POA: Insufficient documentation

## 2023-07-09 DIAGNOSIS — Z7689 Persons encountering health services in other specified circumstances: Secondary | ICD-10-CM

## 2023-07-09 DIAGNOSIS — Z136 Encounter for screening for cardiovascular disorders: Secondary | ICD-10-CM

## 2023-07-09 DIAGNOSIS — Z0000 Encounter for general adult medical examination without abnormal findings: Secondary | ICD-10-CM | POA: Diagnosis not present

## 2023-07-09 DIAGNOSIS — Z13220 Encounter for screening for lipoid disorders: Secondary | ICD-10-CM

## 2023-07-09 NOTE — Progress Notes (Signed)
Complete physical exam  Patient: Roberta Livingston   DOB: 1961-01-19   61 y.o. Female  MRN: [id number]  Subjective:    Chief Complaint  Patient presents with   Establish Care    Patient is here today as a new patient to establish care and would like a physical today     Roberta Livingston is a 62 y.o. female who presents today for a complete physical exam. She reports consuming a general diet.  Very busy at home  She generally feels well. She reports sleeping well. She does not have additional problems to discuss today.    Most recent fall risk assessment:    07/09/2023   10:08 AM  Fall Risk   Falls in the past year? 0  Number falls in past yr: 0  Injury with Fall? 0  Risk for fall due to : No Fall Risks  Follow up Falls evaluation completed     Most recent depression screenings:    07/09/2023   10:08 AM 07/03/2022    9:30 AM  PHQ 2/9 Scores  PHQ - 2 Score 0 0  PHQ- 9 Score 2     Vision:Within last year and Dental: No current dental problems and Receives regular dental care    Patient Care Team: Novella Olive, FNP as PCP - General (Family Medicine)   Outpatient Medications Prior to Visit  Medication Sig   famotidine (PEPCID) 20 MG tablet Take 1 tablet (20 mg total) by mouth daily.   pantoprazole (PROTONIX) 40 MG tablet Take 1 tablet (40 mg total) by mouth daily.   No facility-administered medications prior to visit.    Review of Systems  Eyes:  Negative for blurred vision and double vision.  Respiratory:  Negative for shortness of breath.   Cardiovascular:  Negative for chest pain and leg swelling.  Gastrointestinal:  Negative for nausea and vomiting.  Neurological:  Negative for dizziness and headaches.  Psychiatric/Behavioral:  Negative for depression and suicidal ideas.           Objective:     BP 121/71   Pulse 62   Temp 98 F (36.7 C) (Oral)   Resp 18   Ht 5\' 4"  (1.626 m)   Wt 151 lb 6.4 oz (68.7 kg)   LMP 05/29/2013   SpO2 100%   BMI 25.99 kg/m     Physical Exam Vitals and nursing note reviewed.  Constitutional:      General: She is not in acute distress.    Appearance: Normal appearance.  HENT:     Right Ear: Tympanic membrane normal.     Left Ear: Tympanic membrane normal.     Nose: Nose normal.     Mouth/Throat:     Mouth: Mucous membranes are moist.     Pharynx: Oropharynx is clear.  Eyes:     Extraocular Movements: Extraocular movements intact.  Neck:     Thyroid: No thyroid tenderness.  Cardiovascular:     Rate and Rhythm: Normal rate and regular rhythm.     Pulses:          Radial pulses are 2+ on the right side and 2+ on the left side.       Dorsalis pedis pulses are 2+ on the right side and 2+ on the left side.     Heart sounds: Normal heart sounds, S1 normal and S2 normal.  Pulmonary:     Effort: Pulmonary effort is normal.     Breath sounds: Normal breath sounds.  Abdominal:     General: Bowel sounds are normal.     Palpations: Abdomen is soft.     Tenderness: There is no abdominal tenderness.  Musculoskeletal:        General: Normal range of motion.     Cervical back: Normal range of motion.     Right lower leg: No edema.     Left lower leg: No edema.  Lymphadenopathy:     Cervical:     Right cervical: No superficial cervical adenopathy.    Left cervical: No superficial cervical adenopathy.  Skin:    General: Skin is warm and dry.  Neurological:     General: No focal deficit present.     Mental Status: She is alert. Mental status is at baseline.  Psychiatric:        Mood and Affect: Mood normal.        Behavior: Behavior normal.        Thought Content: Thought content normal.        Judgment: Judgment normal.      No results found for any visits on 07/09/23.     Assessment & Plan:    Routine Health Maintenance and Physical Exam  Immunization History  Administered Date(s) Administered   Influenza Whole 10/30/2010   Influenza,inj,Quad PF,6+ Mos 09/29/2013   Janssen (J&J) SARS-COV-2  Vaccination 03/05/2020   Tdap 09/29/2013   Zoster Recombinant(Shingrix) 12/21/2019, 02/22/2020    Health Maintenance  Topic Date Due   Fecal DNA (Cologuard)  01/03/2023   INFLUENZA VACCINE  07/29/2023 (Originally 06/27/2023)   COVID-19 Vaccine (2 - Janssen risk series) 07/29/2023 (Originally 04/02/2020)   DTaP/Tdap/Td (2 - Td or Tdap) 09/30/2023   MAMMOGRAM  06/14/2024   PAP SMEAR-Modifier  12/20/2024   Hepatitis C Screening  Completed   HIV Screening  Completed   Zoster Vaccines- Shingrix  Completed   HPV VACCINES  Aged Out    Discussed health benefits of physical activity, and encouraged her to engage in regular exercise appropriate for her age and condition.  Establishing care with new doctor, encounter for  Annual physical exam -     CBC with Differential/Platelet -     Comprehensive metabolic panel  Screening for colon cancer -     Cologuard; Future  Encounter for lipid screening for cardiovascular disease -     Lipid panel  Encounter for screening for metabolic disorder -     Comprehensive metabolic panel -     Hemoglobin A1c -     TSH  Screening for thyroid disorder -     TSH  Screening for skin cancer -     Ambulatory referral to Dermatology      Routine labs ordered.  HCM reviewed/discussed. Pap up to date. Cologuard ordered. Recommend smoking cessation. Got mammogram today, results are not back. Anticipatory guidance regarding healthy weight, lifestyle and choices given. Recommend healthy diet.  Recommend approximately 150 minutes/week of moderate intensity exercise. Resistance training is good for building muscles and for bone health. Muscle mass helps to increase our metabolism and to burn more calories at rest.  Limit alcohol consumption: no more than one drink per day for women and 2 drinks per day for me. Recommend regular dental and vision exams. Always use seatbelt/lap and shoulder restraints. Recommend using smoke alarms and checking batteries at  least twice a year. Recommend using sunscreen when outside.  Please know that I am here to help you with all of your health care goals and am happy  to work with you to find a solution that works best for you.  The greatest advice I have received with any changes in life are to take it one step at a time, that even means if all you can focus on is the next 60 seconds, then do that and celebrate your victories.  With any changes in life, you will have set backs, and that is OK. The important thing to remember is, if you have a set back, it is not a failure, it is an opportunity to try again! Agrees with plan of care discussed.  Questions answered.      Return in about 1 year (around 07/08/2024) for CPE with labs.     Novella Olive, FNP

## 2023-07-10 ENCOUNTER — Other Ambulatory Visit: Payer: Self-pay | Admitting: Family Medicine

## 2023-07-10 DIAGNOSIS — E7841 Elevated Lipoprotein(a): Secondary | ICD-10-CM

## 2023-07-10 DIAGNOSIS — R7303 Prediabetes: Secondary | ICD-10-CM | POA: Insufficient documentation

## 2023-07-10 MED ORDER — METFORMIN HCL 500 MG PO TABS: 500.0000 mg | ORAL_TABLET | Freq: Two times a day (BID) | ORAL | 1 refills | Status: DC

## 2023-07-10 MED ORDER — ATORVASTATIN CALCIUM 10 MG PO TABS: 10.0000 mg | ORAL_TABLET | Freq: Every evening | ORAL | 1 refills | Status: DC

## 2023-07-18 DIAGNOSIS — L299 Pruritus, unspecified: Secondary | ICD-10-CM | POA: Diagnosis not present

## 2023-07-18 DIAGNOSIS — L300 Nummular dermatitis: Secondary | ICD-10-CM | POA: Diagnosis not present

## 2023-07-18 DIAGNOSIS — R233 Spontaneous ecchymoses: Secondary | ICD-10-CM | POA: Diagnosis not present

## 2023-08-12 ENCOUNTER — Encounter: Payer: Self-pay | Admitting: Family Medicine

## 2023-08-12 ENCOUNTER — Ambulatory Visit (INDEPENDENT_AMBULATORY_CARE_PROVIDER_SITE_OTHER): Payer: BC Managed Care – PPO | Admitting: Family Medicine

## 2023-08-12 VITALS — BP 107/61 | HR 60 | Temp 97.8°F | Resp 18 | Ht 64.0 in | Wt 142.3 lb

## 2023-08-12 DIAGNOSIS — R7303 Prediabetes: Secondary | ICD-10-CM

## 2023-08-12 DIAGNOSIS — E785 Hyperlipidemia, unspecified: Secondary | ICD-10-CM | POA: Diagnosis not present

## 2023-08-12 DIAGNOSIS — Z23 Encounter for immunization: Secondary | ICD-10-CM

## 2023-08-12 DIAGNOSIS — Z1211 Encounter for screening for malignant neoplasm of colon: Secondary | ICD-10-CM

## 2023-08-12 DIAGNOSIS — R748 Abnormal levels of other serum enzymes: Secondary | ICD-10-CM | POA: Diagnosis not present

## 2023-08-12 NOTE — Assessment & Plan Note (Signed)
Taking metformin 500 mg BID with meals as prescribed. Tolerating well, no side effects reported. Will check A1C after 11/14. Recommend lifestyle changes.

## 2023-08-12 NOTE — Assessment & Plan Note (Signed)
GGT ordered today for further assessment. Next steps to be determined.

## 2023-08-12 NOTE — Assessment & Plan Note (Signed)
[id number] season influenza vaccine given today.

## 2023-08-12 NOTE — Assessment & Plan Note (Signed)
Taking atorvastatin 10 mg as prescribed. Tolerating well, no side effects reported. Continue therapy. No refill needed today.

## 2023-08-12 NOTE — Progress Notes (Signed)
Established Patient Office Visit  Subjective   Patient ID: Roberta Livingston, female    DOB: November 01, 1961  Age: 62 y.o. MRN: [id number]  Chief Complaint  Patient presents with   Prediabetes   Hyperlipidemia    Started medications 07/10/23.   Interpreter services present during visit.   HPI   Diabetes: Medication compliance: taking metformin 500 mg BID with meals as prescribed on 8/14.  Denies chest pain, shortness of breath, vision changes, polydipsia, polyphagia, polyuria. Denies hypoglycemia.  Pertinent lab work: A1C: 8/13: 6.0 Monitoring: blood sugar readings at home: n/a          Continue current medication regimen: no changes   Well controlled: will check A1C in Novemeber to assess effectiveness   Follow-up: after 10/10/23    Hyperlipidemia:  Medication compliance: taking atorvastatin 10 mg as prescribed on 8/14 Denies chest pain, shortness of breath, lower extremity edema, myalgias, muscle weakness, changes in appearance of urine.  Pertinent lab work: 8/13: lipid panel  Continue current medication regimen: no changes. Recheck lipids in one year of therapy.  Follow-up: November   Elevated alkaline phosphatase: 07/09/23: 148. Will get ggt today.   Cologuard: has not received. Will reorder today.   Influenza: wants flu vaccine today.   Covid vaccine: declines today.      Review of Systems  Eyes:  Negative for blurred vision and double vision.  Respiratory:  Negative for shortness of breath.   Cardiovascular:  Negative for chest pain.  Gastrointestinal:  Negative for abdominal pain, diarrhea, nausea and vomiting.  Endo/Heme/Allergies:  Negative for polydipsia.      Objective:     BP 107/61   Pulse 60   Temp 97.8 F (36.6 C) (Oral)   Resp 18   Ht 5\' 4"  (1.626 m)   Wt 142 lb 4.8 oz (64.5 kg)   LMP 05/29/2013   SpO2 96%   BMI 24.43 kg/m  BP Readings from Last 3 Encounters:  08/12/23 107/61  07/09/23 121/71  07/03/22 (!) 100/54      Physical  Exam Vitals and nursing note reviewed.  Constitutional:      General: She is not in acute distress.    Appearance: Normal appearance. She is normal weight.  Cardiovascular:     Rate and Rhythm: Normal rate and regular rhythm.     Heart sounds: Normal heart sounds.  Pulmonary:     Effort: Pulmonary effort is normal.     Breath sounds: Normal breath sounds.  Skin:    General: Skin is warm and dry.  Neurological:     General: No focal deficit present.     Mental Status: She is alert. Mental status is at baseline.  Psychiatric:        Mood and Affect: Mood normal.        Behavior: Behavior normal.        Thought Content: Thought content normal.        Judgment: Judgment normal.     No results found for any visits on 08/12/23.  Last lipids Lab Results  Component Value Date   CHOL 280 (H) 07/09/2023   HDL 71 07/09/2023   LDLCALC 184 (H) 07/09/2023   TRIG 139 07/09/2023   CHOLHDL 3.9 07/09/2023   Last hemoglobin A1c Lab Results  Component Value Date   HGBA1C 6.0 (H) 07/09/2023      The 10-year ASCVD risk score (Arnett DK, et al., 2019) is: 5%    Assessment & Plan:   Problem List Items Addressed  This Visit     Hyperlipidemia    Taking atorvastatin 10 mg as prescribed. Tolerating well, no side effects reported. Continue therapy. No refill needed today.       Encounter for administration of vaccine    2024-2025 season influenza vaccine given today.       Relevant Orders   Flu vaccine trivalent PF, 6mos and older(Flulaval,Afluria,Fluarix,Fluzone)   Prediabetes    Taking metformin 500 mg BID with meals as prescribed. Tolerating well, no side effects reported. Will check A1C after 11/14. Recommend lifestyle changes.       Elevated alkaline phosphatase level - Primary    GGT ordered today for further assessment. Next steps to be determined.        Relevant Orders   Gamma GT  Cologuard ordered today for colon cancer screening.  Agrees with plan of care  discussed.  Questions answered.   Return in about 8 weeks (around 10/10/2023) for DM.    Novella Olive, FNP

## 2023-08-13 LAB — GAMMA GT: GGT: 9 IU/L (ref 0–60)

## 2023-08-15 DIAGNOSIS — L578 Other skin changes due to chronic exposure to nonionizing radiation: Secondary | ICD-10-CM | POA: Diagnosis not present

## 2023-08-15 DIAGNOSIS — D225 Melanocytic nevi of trunk: Secondary | ICD-10-CM | POA: Diagnosis not present

## 2023-08-15 DIAGNOSIS — L821 Other seborrheic keratosis: Secondary | ICD-10-CM | POA: Diagnosis not present

## 2023-08-15 DIAGNOSIS — L814 Other melanin hyperpigmentation: Secondary | ICD-10-CM | POA: Diagnosis not present

## 2023-08-26 DIAGNOSIS — Z1211 Encounter for screening for malignant neoplasm of colon: Secondary | ICD-10-CM | POA: Diagnosis not present

## 2023-08-30 LAB — COLOGUARD: COLOGUARD: NEGATIVE

## 2023-09-12 DIAGNOSIS — L300 Nummular dermatitis: Secondary | ICD-10-CM | POA: Diagnosis not present

## 2023-09-15 ENCOUNTER — Other Ambulatory Visit: Payer: Self-pay | Admitting: Family Medicine

## 2023-09-15 DIAGNOSIS — E7841 Elevated Lipoprotein(a): Secondary | ICD-10-CM

## 2023-09-15 DIAGNOSIS — R7303 Prediabetes: Secondary | ICD-10-CM

## 2023-10-07 ENCOUNTER — Encounter: Payer: Self-pay | Admitting: Family Medicine

## 2023-10-07 ENCOUNTER — Ambulatory Visit (INDEPENDENT_AMBULATORY_CARE_PROVIDER_SITE_OTHER): Payer: BC Managed Care – PPO | Admitting: Family Medicine

## 2023-10-07 DIAGNOSIS — R7303 Prediabetes: Secondary | ICD-10-CM | POA: Diagnosis not present

## 2023-10-07 MED ORDER — METFORMIN HCL 500 MG PO TABS
500.0000 mg | ORAL_TABLET | Freq: Two times a day (BID) | ORAL | 0 refills | Status: DC
Start: 2023-10-07 — End: 2024-01-07

## 2023-10-07 NOTE — Assessment & Plan Note (Signed)
Taking metformin 500 mg BID as prescribed. Tolerating well.  Denies chest pain, shortness of breath, vision changes, polydipsia, polyphagia, polyuria. Denies hypoglycemia.  8/13: A1C 6.0, will recheck today. Medication adjustments based upon results.  No changes today.  Follow-up in 3 months.

## 2023-10-07 NOTE — Progress Notes (Signed)
   Established Patient Office Visit  Subjective   Patient ID: Roberta Livingston, female    DOB: [birth year]  Age: 62 y.o. MRN: [id number]  Chief Complaint  Patient presents with   Medical Management of Chronic Issues    Patient is here for a follow up for DM    HPI  Diabetes: Medication compliance: Taking metformin 500 mg BID as prescribed.  Denies chest pain, shortness of breath, vision changes, polydipsia, polyphagia, polyuria. Denies hypoglycemia.  Pertinent lab work: A1C: 8/13: A1C 6.0 Monitoring: blood sugar readings at home: not monitoring at home          Continue current medication regimen: no changes   Well controlled: will check today   Follow-up: 3 months     Review of Systems  Eyes:  Negative for blurred vision and double vision.  Respiratory:  Negative for shortness of breath.   Cardiovascular:  Negative for chest pain and leg swelling.  Endo/Heme/Allergies:  Negative for polydipsia.      Objective:     BP 98/64 (BP Location: Right Arm, Patient Position: Sitting, Cuff Size: Normal)   Pulse 64   Temp 98 F (36.7 C) (Oral)   Resp 20   Ht 5\' 4"  (1.626 m)   Wt 137 lb 3.2 oz (62.2 kg)   LMP 05/29/2013   SpO2 98%   BMI 23.55 kg/m  BP Readings from Last 3 Encounters:  10/07/23 98/64  08/12/23 107/61  07/09/23 121/71      Physical Exam Vitals and nursing note reviewed.  Constitutional:      General: She is not in acute distress.    Appearance: Normal appearance. She is normal weight.  Cardiovascular:     Rate and Rhythm: Normal rate and regular rhythm.     Heart sounds: Normal heart sounds.  Pulmonary:     Effort: Pulmonary effort is normal.     Breath sounds: Normal breath sounds.  Skin:    General: Skin is warm and dry.  Neurological:     General: No focal deficit present.     Mental Status: She is alert. Mental status is at baseline.  Psychiatric:        Mood and Affect: Mood normal.        Behavior: Behavior normal.        Thought Content:  Thought content normal.        Judgment: Judgment normal.      No results found for any visits on 10/07/23.  Last hemoglobin A1c Lab Results  Component Value Date   HGBA1C 6.0 (H) 07/09/2023      The 10-year ASCVD risk score (Arnett DK, et al., 2019) is: 4.5%    Assessment & Plan:   Problem List Items Addressed This Visit     Prediabetes    Taking metformin 500 mg BID as prescribed. Tolerating well.  Denies chest pain, shortness of breath, vision changes, polydipsia, polyphagia, polyuria. Denies hypoglycemia.  8/13: A1C 6.0, will recheck today. Medication adjustments based upon results.  No changes today.  Follow-up in 3 months.        Relevant Medications   metFORMIN (GLUCOPHAGE) 500 MG tablet   Other Relevant Orders   Hemoglobin A1c  Agrees with plan of care discussed.  Questions answered.   Return in about 3 months (around 01/07/2024) for DM.    Novella Olive, FNP

## 2023-10-08 LAB — HEMOGLOBIN A1C
Est. average glucose Bld gHb Est-mCnc: 120 mg/dL
Hgb A1c MFr Bld: 5.8 % — ABNORMAL HIGH (ref 4.8–5.6)

## 2023-12-03 ENCOUNTER — Other Ambulatory Visit: Payer: Self-pay | Admitting: Family Medicine

## 2023-12-03 DIAGNOSIS — R7303 Prediabetes: Secondary | ICD-10-CM

## 2023-12-03 DIAGNOSIS — E7841 Elevated Lipoprotein(a): Secondary | ICD-10-CM

## 2024-01-07 ENCOUNTER — Encounter: Payer: Self-pay | Admitting: Family Medicine

## 2024-01-07 ENCOUNTER — Ambulatory Visit (INDEPENDENT_AMBULATORY_CARE_PROVIDER_SITE_OTHER): Payer: BC Managed Care – PPO | Admitting: Family Medicine

## 2024-01-07 VITALS — BP 90/62 | HR 75 | Temp 98.4°F | Resp 18 | Ht 64.0 in | Wt 137.5 lb

## 2024-01-07 DIAGNOSIS — E785 Hyperlipidemia, unspecified: Secondary | ICD-10-CM

## 2024-01-07 DIAGNOSIS — R7303 Prediabetes: Secondary | ICD-10-CM

## 2024-01-07 MED ORDER — METFORMIN HCL 500 MG PO TABS: 500.0000 mg | ORAL_TABLET | Freq: Two times a day (BID) | ORAL | 0 refills | Status: DC

## 2024-01-07 NOTE — Assessment & Plan Note (Signed)
Taking metformin 500 mg BID.  Denies chest pain, shortness of breath, vision changes, polydipsia, polyphagia, polyuria. Denies hypoglycemia.  Will check A1C today. Refill sent. Follow-up in 3 months.

## 2024-01-07 NOTE — Assessment & Plan Note (Signed)
Taking atorvastatin 10 mg daily as prescribed.  Tolerating medication without reports side effects. No refills needed today. Follow-up in 3 months.

## 2024-01-07 NOTE — Progress Notes (Signed)
Established Patient Office Visit  Subjective   Patient ID: Roberta Livingston, female    DOB: 1960/12/17  Age: 63 y.o. MRN: [id number]  Chief Complaint  Patient presents with   Medical Management of Chronic Issues    Patient is here for a 3 month follow up for DM  Interpreter services in place during visit today.   HPI  Diabetes: Medication compliance: Taking metformin 500 mg BID.  Denies chest pain, shortness of breath, vision changes, polydipsia, polyphagia, polyuria. Denies hypoglycemia.  Pertinent lab work: A1C: 10/07/23: A1C 5.8 Monitoring: blood sugar readings at home: n/a           Continue current medication regimen: no changes   Well controlled: will check A1C today  Follow-up: 3 months   Hyperlipidemia:  Medication compliance: Taking atorvastatin 10 mg daily as prescribed.  Denies chest pain, shortness of breath, lower extremity edema, myalgias, muscle weakness, changes in appearance of urine.  Pertinent lab work: 07/09/23: lipid panel  Continue current medication regimen: no changes today Follow-up: 3 months    Feels sleepy when eating her lunch.  Feels okay once done eating.     Review of Systems  Eyes:  Negative for blurred vision and double vision.  Respiratory:  Negative for shortness of breath.   Cardiovascular:  Negative for chest pain.  Neurological:  Negative for dizziness and headaches.  Endo/Heme/Allergies:  Negative for polydipsia.      Objective:     BP 90/62 (BP Location: Right Arm, Patient Position: Sitting, Cuff Size: Normal)   Pulse 75   Temp 98.4 F (36.9 C) (Oral)   Resp 18   Ht 5\' 4"  (1.626 m)   Wt 137 lb 8 oz (62.4 kg)   LMP 05/29/2013   SpO2 96%   BMI 23.60 kg/m  BP Readings from Last 3 Encounters:  01/07/24 90/62  10/07/23 98/64  08/12/23 107/61      Physical Exam Vitals and nursing note reviewed.  Constitutional:      General: She is not in acute distress.    Appearance: Normal appearance.  Cardiovascular:     Rate  and Rhythm: Normal rate and regular rhythm.     Heart sounds: Normal heart sounds.  Pulmonary:     Effort: Pulmonary effort is normal.     Breath sounds: Normal breath sounds.  Skin:    General: Skin is warm and dry.  Neurological:     General: No focal deficit present.     Mental Status: She is alert. Mental status is at baseline.  Psychiatric:        Mood and Affect: Mood normal.        Behavior: Behavior normal.        Thought Content: Thought content normal.        Judgment: Judgment normal.     No results found for any visits on 01/07/24.  Last hemoglobin A1c Lab Results  Component Value Date   HGBA1C 5.8 (H) 10/07/2023      The 10-year ASCVD risk score (Arnett DK, et al., 2019) is: 3.9%    Assessment & Plan:   Problem List Items Addressed This Visit     Hyperlipidemia   Taking atorvastatin 10 mg daily as prescribed.  Tolerating medication without reports side effects. No refills needed today. Follow-up in 3 months.       Prediabetes - Primary   Taking metformin 500 mg BID.  Denies chest pain, shortness of breath, vision changes, polydipsia, polyphagia, polyuria. Denies hypoglycemia.  Will check A1C today. Refill sent. Follow-up in 3 months.        Relevant Medications   metFORMIN (GLUCOPHAGE) 500 MG tablet   Other Relevant Orders   Hemoglobin A1c  Agrees with plan of care discussed.  Questions answered.   Return in about 3 months (around 04/06/2024) for DM.    Novella Olive, FNP

## 2024-01-08 ENCOUNTER — Encounter: Payer: Self-pay | Admitting: Family Medicine

## 2024-01-08 LAB — HEMOGLOBIN A1C
Est. average glucose Bld gHb Est-mCnc: 117 mg/dL
Hgb A1c MFr Bld: 5.7 % — ABNORMAL HIGH (ref 4.8–5.6)

## 2024-01-22 ENCOUNTER — Other Ambulatory Visit: Payer: Self-pay | Admitting: Family Medicine

## 2024-01-22 DIAGNOSIS — E7841 Elevated Lipoprotein(a): Secondary | ICD-10-CM

## 2024-01-22 DIAGNOSIS — R7303 Prediabetes: Secondary | ICD-10-CM

## 2024-04-06 ENCOUNTER — Ambulatory Visit (INDEPENDENT_AMBULATORY_CARE_PROVIDER_SITE_OTHER): Payer: BC Managed Care – PPO | Admitting: Family Medicine

## 2024-04-06 ENCOUNTER — Encounter: Payer: Self-pay | Admitting: Family Medicine

## 2024-04-06 VITALS — BP 105/65 | HR 75 | Temp 98.9°F | Resp 18 | Ht 64.0 in | Wt 134.3 lb

## 2024-04-06 DIAGNOSIS — E785 Hyperlipidemia, unspecified: Secondary | ICD-10-CM | POA: Diagnosis not present

## 2024-04-06 DIAGNOSIS — R7303 Prediabetes: Secondary | ICD-10-CM

## 2024-04-06 DIAGNOSIS — E7841 Elevated Lipoprotein(a): Secondary | ICD-10-CM

## 2024-04-06 MED ORDER — METFORMIN HCL 500 MG PO TABS: 500.0000 mg | ORAL_TABLET | Freq: Two times a day (BID) | ORAL | 1 refills | Status: DC

## 2024-04-06 MED ORDER — ATORVASTATIN CALCIUM 10 MG PO TABS: 10.0000 mg | ORAL_TABLET | Freq: Every day | ORAL | 1 refills | Status: DC

## 2024-04-06 NOTE — Assessment & Plan Note (Signed)
 Taking atorvastatin  10 mg daily as prescribed.  Denies chest pain, shortness of breath, lower extremity edema, myalgias, muscle weakness, changes in appearance of urine.  Pertinent lab work: 07/09/23: LDL 184 Recheck lipid panel today. Refill sent. Follow-up in 6 months.

## 2024-04-06 NOTE — Assessment & Plan Note (Signed)
 Taking metformin  500 mg BID as prescribed. Denies chest pain, shortness of breath, vision changes, polydipsia, polyphagia, polyuria. Denies hypoglycemia.  Pertinent lab work: A1C: 2/11 A1c 5.7 Recheck A1C today. Refill sent. Follow-up in 6 months for medication management.

## 2024-04-06 NOTE — Progress Notes (Signed)
 Established Patient Office Visit  Subjective   Patient ID: Roberta Livingston, female    DOB: 08-27-1961  Age: 63 y.o. MRN: [id number]  Chief Complaint  Patient presents with   Medical Management of Chronic Issues    PDM last A1C 5.7 no questions or concerns  Interpreter services in place during visit.   HPI Diabetes: Medication compliance: Taking metformin  500 mg BID as prescribed. Denies chest pain, shortness of breath, vision changes, polydipsia, polyphagia, polyuria. Denies hypoglycemia.  Pertinent lab work: A1C: 2/11 A1c 5.7 Monitoring: blood sugar readings at home: n/a           Continue current medication regimen: no change  Well controlled: check A1C today   Follow-up: 6 months   Hyperlipidemia:  Medication compliance: Taking atorvastatin  10 mg daily as prescribed.  Denies chest pain, shortness of breath, lower extremity edema, myalgias, muscle weakness, changes in appearance of urine.  Pertinent lab work: 07/09/23: LDL 184, recheck today Continue current medication regimen: no changes  Follow-up: 6 months      Review of Systems  Eyes:  Negative for blurred vision and double vision.  Respiratory:  Negative for shortness of breath.   Cardiovascular:  Negative for chest pain.  Neurological:  Negative for headaches.      Objective:     BP 105/65 (BP Location: Left Arm, Patient Position: Sitting, Cuff Size: Normal)   Pulse 75   Temp 98.9 F (37.2 C) (Oral)   Resp 18   Ht 5\' 4"  (1.626 m)   Wt 134 lb 5 oz (60.9 kg)   LMP 05/29/2013   SpO2 100%   BMI 23.05 kg/m  BP Readings from Last 3 Encounters:  04/06/24 105/65  01/07/24 90/62  10/07/23 98/64      Physical Exam Vitals and nursing note reviewed.  Constitutional:      General: She is not in acute distress.    Appearance: Normal appearance. She is normal weight.  Cardiovascular:     Rate and Rhythm: Regular rhythm.     Heart sounds: Normal heart sounds.  Pulmonary:     Effort: Pulmonary effort is  normal.     Breath sounds: Normal breath sounds.  Skin:    General: Skin is warm and dry.  Neurological:     General: No focal deficit present.     Mental Status: She is alert. Mental status is at baseline.  Psychiatric:        Mood and Affect: Mood normal.        Behavior: Behavior normal.        Thought Content: Thought content normal.        Judgment: Judgment normal.     No results found for any visits on 04/06/24.  Last lipids Lab Results  Component Value Date   CHOL 280 (H) 07/09/2023   HDL 71 07/09/2023   LDLCALC 184 (H) 07/09/2023   TRIG 139 07/09/2023   CHOLHDL 3.9 07/09/2023   Last hemoglobin A1c Lab Results  Component Value Date   HGBA1C 5.7 (H) 01/07/2024      The 10-year ASCVD risk score (Arnett DK, et al., 2019) is: 5.2%    Assessment & Plan:   Problem List Items Addressed This Visit     Elevated lipoprotein(a)   Taking atorvastatin  10 mg daily as prescribed.  Denies chest pain, shortness of breath, lower extremity edema, myalgias, muscle weakness, changes in appearance of urine.  Pertinent lab work: 07/09/23: LDL 184 Recheck lipid panel today. Refill sent. Follow-up  in 6 months.       Relevant Medications   atorvastatin  (LIPITOR) 10 MG tablet   Other Relevant Orders   Lipid panel   Prediabetes - Primary   Taking metformin  500 mg BID as prescribed. Denies chest pain, shortness of breath, vision changes, polydipsia, polyphagia, polyuria. Denies hypoglycemia.  Pertinent lab work: A1C: 2/11 A1c 5.7 Recheck A1C today. Refill sent. Follow-up in 6 months for medication management.       Relevant Medications   atorvastatin  (LIPITOR) 10 MG tablet   metFORMIN  (GLUCOPHAGE ) 500 MG tablet   Other Relevant Orders   Hemoglobin A1c   Basic metabolic panel with GFR  Agrees with plan of care discussed.  Questions answered.   Return in about 6 months (around 10/07/2024) for DM, HLD .    Mickiel Albany, FNP

## 2024-04-07 ENCOUNTER — Ambulatory Visit: Payer: Self-pay | Admitting: Family Medicine

## 2024-04-07 LAB — HEMOGLOBIN A1C
Est. average glucose Bld gHb Est-mCnc: 120 mg/dL
Hgb A1c MFr Bld: 5.8 % — ABNORMAL HIGH (ref 4.8–5.6)

## 2024-04-07 LAB — BASIC METABOLIC PANEL WITH GFR
BUN/Creatinine Ratio: 18 (ref 12–28)
BUN: 12 mg/dL (ref 8–27)
CO2: 23 mmol/L (ref 20–29)
Calcium: 9.3 mg/dL (ref 8.7–10.3)
Chloride: 103 mmol/L (ref 96–106)
Creatinine, Ser: 0.68 mg/dL (ref 0.57–1.00)
Glucose: 82 mg/dL (ref 70–99)
Potassium: 4.3 mmol/L (ref 3.5–5.2)
Sodium: 140 mmol/L (ref 134–144)
eGFR: 100 mL/min/{1.73_m2} (ref >59)

## 2024-04-07 LAB — LIPID PANEL
Chol/HDL Ratio: 2.7 ratio (ref 0.0–4.4)
Cholesterol, Total: 175 mg/dL (ref 100–199)
HDL: 65 mg/dL (ref >39)
LDL Chol Calc (NIH): 93 mg/dL (ref 0–99)
Triglycerides: 95 mg/dL (ref 0–149)
VLDL Cholesterol Cal: 17 mg/dL (ref 5–40)

## 2024-07-06 ENCOUNTER — Other Ambulatory Visit: Payer: Self-pay | Admitting: Family Medicine

## 2024-07-06 DIAGNOSIS — Z1231 Encounter for screening mammogram for malignant neoplasm of breast: Secondary | ICD-10-CM

## 2024-07-14 ENCOUNTER — Encounter: Admitting: Family Medicine

## 2024-07-15 ENCOUNTER — Ambulatory Visit (INDEPENDENT_AMBULATORY_CARE_PROVIDER_SITE_OTHER)

## 2024-07-15 DIAGNOSIS — Z1231 Encounter for screening mammogram for malignant neoplasm of breast: Secondary | ICD-10-CM | POA: Diagnosis not present

## 2024-07-16 ENCOUNTER — Ambulatory Visit (INDEPENDENT_AMBULATORY_CARE_PROVIDER_SITE_OTHER): Admitting: Family Medicine

## 2024-07-16 ENCOUNTER — Encounter: Payer: Self-pay | Admitting: Family Medicine

## 2024-07-16 VITALS — BP 109/66 | HR 69 | Temp 98.4°F | Ht 64.0 in | Wt 132.0 lb

## 2024-07-16 DIAGNOSIS — Z13228 Encounter for screening for other metabolic disorders: Secondary | ICD-10-CM | POA: Diagnosis not present

## 2024-07-16 DIAGNOSIS — R7303 Prediabetes: Secondary | ICD-10-CM

## 2024-07-16 DIAGNOSIS — Z0000 Encounter for general adult medical examination without abnormal findings: Secondary | ICD-10-CM | POA: Diagnosis not present

## 2024-07-16 NOTE — Progress Notes (Signed)
 Complete physical exam  Patient: Roberta Livingston   DOB: [birth year]   63 y.o. Female  MRN: [id number]  Subjective:    Chief Complaint  Patient presents with   Annual Exam  Interpreter in place during visit.   Roberta Livingston is a 63 y.o. female who presents today for a complete physical exam. She reports consuming a general diet. Walking 30 minutes 2-3 times per week.  She generally feels well. She reports sleeping well. She does not have additional problems to discuss today.    Most recent fall risk assessment:    07/16/2024    1:47 PM  Fall Risk   Falls in the past year? 0  Number falls in past yr: 0  Injury with Fall? 0     Most recent depression screenings:    07/16/2024    1:48 PM 07/09/2023   10:08 AM  PHQ 2/9 Scores  PHQ - 2 Score 0 0  PHQ- 9 Score 0 2    Vision:Within last year and Dental: No current dental problems and Receives regular dental care    Patient Care Team: Booker Darice SAUNDERS, FNP as PCP - General (Family Medicine)   Outpatient Medications Prior to Visit  Medication Sig   metFORMIN  (GLUCOPHAGE ) 500 MG tablet Take 1 tablet (500 mg total) by mouth 2 (two) times daily with a meal.   atorvastatin  (LIPITOR) 10 MG tablet Take 1 tablet (10 mg total) by mouth daily.   No facility-administered medications prior to visit.    ROS        Objective:     BP 109/66 (BP Location: Left Arm, Patient Position: Sitting, Cuff Size: Small)   Pulse 69   Temp 98.4 F (36.9 C) (Oral)   Ht 5' 4 (1.626 m)   Wt 132 lb (59.9 kg)   LMP 05/29/2013   SpO2 98%   BMI 22.66 kg/m    Physical Exam Vitals and nursing note reviewed.  Constitutional:      General: She is not in acute distress.    Appearance: Normal appearance.  HENT:     Right Ear: Tympanic membrane normal.     Left Ear: Tympanic membrane normal.     Nose: Nose normal.     Mouth/Throat:     Mouth: Mucous membranes are moist.     Pharynx: Oropharynx is clear.  Eyes:     Extraocular Movements:  Extraocular movements intact.  Neck:     Thyroid : No thyroid  tenderness.  Cardiovascular:     Rate and Rhythm: Normal rate and regular rhythm.     Pulses:          Radial pulses are 2+ on the right side and 2+ on the left side.     Heart sounds: Normal heart sounds, S1 normal and S2 normal.  Pulmonary:     Effort: Pulmonary effort is normal.     Breath sounds: Normal breath sounds.  Abdominal:     General: Bowel sounds are normal.     Palpations: Abdomen is soft.     Tenderness: There is no abdominal tenderness.  Musculoskeletal:        General: Normal range of motion.     Cervical back: Normal range of motion.     Right lower leg: No edema.     Left lower leg: No edema.  Lymphadenopathy:     Cervical:     Right cervical: No superficial cervical adenopathy.    Left cervical: No superficial cervical adenopathy.  Skin:  General: Skin is warm and dry.  Neurological:     General: No focal deficit present.     Mental Status: She is alert. Mental status is at baseline.  Psychiatric:        Mood and Affect: Mood normal.        Behavior: Behavior normal.        Thought Content: Thought content normal.        Judgment: Judgment normal.      No results found for any visits on 07/16/24.     Assessment & Plan:    Routine Health Maintenance and Physical Exam  Immunization History  Administered Date(s) Administered   Influenza Whole 10/30/2010   Influenza, Seasonal, Injecte, Preservative Fre 08/12/2023   Influenza,inj,Quad PF,6+ Mos 09/29/2013   Janssen (J&J) SARS-COV-2 Vaccination 03/05/2020   Tdap 09/29/2013   Zoster Recombinant(Shingrix ) 12/21/2019, 02/22/2020    Health Maintenance  Topic Date Due   Pneumococcal Vaccine: 50+ Years (1 of 2 - PCV) Never done   COVID-19 Vaccine (2 - Janssen risk series) 04/02/2020   DTaP/Tdap/Td (2 - Td or Tdap) 09/30/2023   INFLUENZA VACCINE  06/26/2024   Cervical Cancer Screening (HPV/Pap Cotest)  12/20/2024   MAMMOGRAM  07/08/2025    Fecal DNA (Cologuard)  08/25/2026   Hepatitis C Screening  Completed   HIV Screening  Completed   Zoster Vaccines- Shingrix   Completed   Hepatitis B Vaccines 19-59 Average Risk  Aged Out   HPV VACCINES  Aged Out   Meningococcal B Vaccine  Aged Out    Discussed health benefits of physical activity, and encouraged her to engage in regular exercise appropriate for her age and condition.  Annual physical exam -     CBC  Encounter for screening for metabolic disorder -     Hemoglobin A1c -     TSH + free T4  Prediabetes -     Hemoglobin A1c      Routine labs ordered. BMP and Lipid panel on 04/06/24: will not repeat today.  HCM reviewed/discussed. Mammogram done yesterday. Anticipatory guidance regarding healthy weight, lifestyle and choices given. Recommend healthy diet.  Recommend approximately 150 minutes/week of moderate intensity exercise. Resistance training is good for building muscles and for bone health. Muscle mass helps to increase our metabolism and to burn more calories at rest.  Limit alcohol consumption: no more than one drink per day for women and 2 drinks per day for me. Recommend regular dental and vision exams. Always use seatbelt/lap and shoulder restraints. Recommend using smoke alarms and checking batteries at least twice a year. Recommend using sunscreen when outside. Agrees with plan of care discussed.  Questions answered.      Return in about 3 months (around 10/09/2024) for DM, HLD .     Darice JONELLE Brownie, FNP

## 2024-07-17 ENCOUNTER — Ambulatory Visit: Payer: Self-pay | Admitting: Family Medicine

## 2024-07-17 LAB — CBC
Hematocrit: 47.6 % — ABNORMAL HIGH (ref 34.0–46.6)
Hemoglobin: 15.6 g/dL (ref 11.1–15.9)
MCH: 29.7 pg (ref 26.6–33.0)
MCHC: 32.8 g/dL (ref 31.5–35.7)
MCV: 91 fL (ref 79–97)
Platelets: 292 x10E3/uL (ref 150–450)
RBC: 5.26 x10E6/uL (ref 3.77–5.28)
RDW: 13.5 % (ref 11.7–15.4)
WBC: 6.2 x10E3/uL (ref 3.4–10.8)

## 2024-07-17 LAB — TSH+FREE T4
Free T4: 1.44 ng/dL (ref 0.82–1.77)
TSH: 1.37 u[IU]/mL (ref 0.450–4.500)

## 2024-07-17 LAB — HEMOGLOBIN A1C
Est. average glucose Bld gHb Est-mCnc: 114 mg/dL
Hgb A1c MFr Bld: 5.6 % (ref 4.8–5.6)

## 2024-07-28 ENCOUNTER — Encounter: Payer: Self-pay | Admitting: Sports Medicine

## 2024-10-12 ENCOUNTER — Ambulatory Visit (INDEPENDENT_AMBULATORY_CARE_PROVIDER_SITE_OTHER): Admitting: Family Medicine

## 2024-10-12 ENCOUNTER — Encounter: Payer: Self-pay | Admitting: Family Medicine

## 2024-10-12 VITALS — BP 103/62 | HR 67 | Ht 64.0 in | Wt 132.0 lb

## 2024-10-12 DIAGNOSIS — R7303 Prediabetes: Secondary | ICD-10-CM | POA: Diagnosis not present

## 2024-10-12 DIAGNOSIS — E785 Hyperlipidemia, unspecified: Secondary | ICD-10-CM | POA: Diagnosis not present

## 2024-10-12 MED ORDER — ATORVASTATIN CALCIUM 10 MG PO TABS: 10.0000 mg | ORAL_TABLET | Freq: Every day | ORAL | 1 refills | Status: AC

## 2024-10-12 MED ORDER — METFORMIN HCL 500 MG PO TABS: 500.0000 mg | ORAL_TABLET | Freq: Two times a day (BID) | ORAL | 1 refills | Status: AC

## 2024-10-12 NOTE — Assessment & Plan Note (Addendum)
 Pre-diabetes: metformin  500 mg BID Last A1C on 07/16/24: 5.6 Will  hold metformin  until A1C recheck next week. A1C on 10/19/24.  If continues to be stable she will stop taking and watch her diet.

## 2024-10-12 NOTE — Progress Notes (Signed)
   Established Patient Office Visit  Subjective   Patient ID: Roberta Livingston, female    DOB: May 23, 1961  Age: 63 y.o. MRN: [id number]  Chief Complaint  Patient presents with   Medical Management of Chronic Issues    DM HLD METFORMIN  - finished today was wondering if she still has to keep taking it     HLD: atorvastatin  10 mg daily. Tolerating well. 04/06/24: LDL improved from 184, 93.  Lipid on 11/24 (not fasting today)   Pre-diabetes: metformin  500 mg BID Wonders if she still needs to take metformin .  She is out of medication, will stop by office on 11/24 for A1C check. Will send refill today, she will wait to pick up after getting results next week.  8/21: A1C: 5.6, down from 5.8 A1C not due until 11/22   Cold weather causes legs/knees to hurt. Recommend avoiding getting too cold by wearing layers.  Multi-vitamin and supplement for bone health.         ROS    Objective:     BP 103/62 (BP Location: Left Arm, Patient Position: Sitting, Cuff Size: Normal)   Pulse 67   Ht 5' 4 (1.626 m)   Wt 132 lb (59.9 kg)   LMP 05/29/2013   SpO2 97%   BMI 22.66 kg/m    Physical Exam Vitals and nursing note reviewed.  Constitutional:      General: She is not in acute distress.    Appearance: Normal appearance.  Cardiovascular:     Rate and Rhythm: Normal rate and regular rhythm.  Pulmonary:     Effort: Pulmonary effort is normal.     Breath sounds: Normal breath sounds.  Musculoskeletal:        General: Normal range of motion.  Skin:    General: Skin is warm and dry.  Neurological:     General: No focal deficit present.     Mental Status: She is alert. Mental status is at baseline.  Psychiatric:        Mood and Affect: Mood normal.        Behavior: Behavior normal.        Thought Content: Thought content normal.        Judgment: Judgment normal.      No results found for any visits on 10/12/24.    The 10-year ASCVD risk score (Arnett DK, et al., 2019)  is: 4%    Assessment & Plan:   Hyperlipidemia, unspecified hyperlipidemia type Assessment & Plan: Taking atorvastatin  10 mg daily. Tolerating well. Not fasting today, will stop by office on 11/24 for lipid panel. Refill sent.    Orders: -     Lipid panel -     Atorvastatin  Calcium ; Take 1 tablet (10 mg total) by mouth daily.  Dispense: 90 tablet; Refill: 1  Prediabetes Assessment & Plan: Pre-diabetes: metformin  500 mg BID Last A1C on 07/16/24: 5.6 Will  hold metformin  until A1C recheck next week. A1C on 10/19/24.  If continues to be stable she will stop taking and watch her diet.   Orders: -     Hemoglobin A1c -     metFORMIN  HCl; Take 1 tablet (500 mg total) by mouth 2 (two) times daily with a meal.  Dispense: 180 tablet; Refill: 1    Agrees with plan of care discussed.  Questions answered.   Return in about 6 months (around 04/11/2025) for pre-diabetes/HLD .    Darice JONELLE Brownie, FNP

## 2024-10-12 NOTE — Assessment & Plan Note (Signed)
 Taking atorvastatin  10 mg daily. Tolerating well. Not fasting today, will stop by office on 11/24 for lipid panel. Refill sent.

## 2024-10-19 DIAGNOSIS — R7303 Prediabetes: Secondary | ICD-10-CM | POA: Diagnosis not present

## 2024-10-19 DIAGNOSIS — E785 Hyperlipidemia, unspecified: Secondary | ICD-10-CM | POA: Diagnosis not present

## 2024-10-20 ENCOUNTER — Ambulatory Visit: Payer: Self-pay | Admitting: Family Medicine

## 2024-10-20 LAB — HEMOGLOBIN A1C
Est. average glucose Bld gHb Est-mCnc: 111 mg/dL
Hgb A1c MFr Bld: 5.5 % (ref 4.8–5.6)

## 2024-10-20 LAB — LIPID PANEL
Chol/HDL Ratio: 2.6 ratio (ref 0.0–4.4)
Cholesterol, Total: 192 mg/dL (ref 100–199)
HDL: 74 mg/dL (ref >39)
LDL Chol Calc (NIH): 102 mg/dL — ABNORMAL HIGH (ref 0–99)
Triglycerides: 88 mg/dL (ref 0–149)
VLDL Cholesterol Cal: 16 mg/dL (ref 5–40)

## 2024-11-09 ENCOUNTER — Other Ambulatory Visit: Payer: Self-pay | Admitting: Family Medicine

## 2024-11-09 DIAGNOSIS — E785 Hyperlipidemia, unspecified: Secondary | ICD-10-CM
